# Patient Record
Sex: Female | Born: 1990 | Race: White | Hispanic: No | Marital: Single | Smoking: Never smoker
Health system: Southern US, Community
[De-identification: ages and names within clinical notes are randomized; demographics above are authoritative.]

## PROBLEM LIST (undated history)

## (undated) DIAGNOSIS — T7840XA Allergy, unspecified, initial encounter: Secondary | ICD-10-CM

## (undated) DIAGNOSIS — K259 Gastric ulcer, unspecified as acute or chronic, without hemorrhage or perforation: Secondary | ICD-10-CM

## (undated) DIAGNOSIS — F419 Anxiety disorder, unspecified: Secondary | ICD-10-CM

## (undated) DIAGNOSIS — J45909 Unspecified asthma, uncomplicated: Secondary | ICD-10-CM

## (undated) HISTORY — DX: Anxiety disorder, unspecified: F41.9

## (undated) HISTORY — DX: Unspecified asthma, uncomplicated: J45.909

## (undated) HISTORY — DX: Allergy, unspecified, initial encounter: T78.40XA

## (undated) HISTORY — PX: KNEE SURGERY: SHX244

---

## 2012-08-19 ENCOUNTER — Encounter (HOSPITAL_COMMUNITY): Payer: Self-pay

## 2012-08-19 ENCOUNTER — Emergency Department (HOSPITAL_COMMUNITY)
Admission: EM | Admit: 2012-08-19 | Discharge: 2012-08-20 | Disposition: A | Discharge status: Home / Self Care | Payer: 59 | Attending: Emergency Medicine | Admitting: Emergency Medicine

## 2012-08-19 DIAGNOSIS — F17200 Nicotine dependence, unspecified, uncomplicated: Secondary | ICD-10-CM | POA: Insufficient documentation

## 2012-08-19 DIAGNOSIS — R109 Unspecified abdominal pain: Secondary | ICD-10-CM

## 2012-08-19 DIAGNOSIS — Z882 Allergy status to sulfonamides status: Secondary | ICD-10-CM | POA: Insufficient documentation

## 2012-08-19 HISTORY — DX: Gastric ulcer, unspecified as acute or chronic, without hemorrhage or perforation: K25.9

## 2012-08-19 LAB — CBC WITH DIFFERENTIAL/PLATELET
Basophils Absolute: 0 10*3/uL (ref 0.0–0.1)
Basophils Relative: 0 % (ref 0–1)
Eosinophils Absolute: 0.1 10*3/uL (ref 0.0–0.7)
Eosinophils Relative: 1 % (ref 0–5)
MCH: 28.3 pg (ref 26.0–34.0)
MCHC: 33 g/dL (ref 30.0–36.0)
MCV: 85.7 fL (ref 78.0–100.0)
Neutrophils Relative %: 60 % (ref 43–77)
Platelets: 193 10*3/uL (ref 150–400)
RBC: 4.35 MIL/uL (ref 3.87–5.11)
RDW: 12.2 % (ref 11.5–15.5)

## 2012-08-19 LAB — URINALYSIS, ROUTINE W REFLEX MICROSCOPIC
Bilirubin Urine: NEGATIVE
Hgb urine dipstick: NEGATIVE
Nitrite: NEGATIVE
Specific Gravity, Urine: 1.027 (ref 1.005–1.030)
Urobilinogen, UA: 1 mg/dL (ref 0.0–1.0)
pH: 6.5 (ref 5.0–8.0)

## 2012-08-19 LAB — COMPREHENSIVE METABOLIC PANEL
ALT: 7 U/L (ref 0–35)
Albumin: 3.8 g/dL (ref 3.5–5.2)
Alkaline Phosphatase: 60 U/L (ref 39–117)
Calcium: 8.9 mg/dL (ref 8.4–10.5)
GFR calc Af Amer: >90 mL/min (ref >90)
Potassium: 3.7 mEq/L (ref 3.5–5.1)
Sodium: 137 mEq/L (ref 135–145)
Total Protein: 6.7 g/dL (ref 6.0–8.3)

## 2012-08-19 LAB — WET PREP, GENITAL
Clue Cells Wet Prep HPF POC: NONE SEEN
Trich, Wet Prep: NONE SEEN

## 2012-08-19 LAB — PREGNANCY, URINE: Preg Test, Ur: NEGATIVE

## 2012-08-19 MED ORDER — ONDANSETRON HCL 4 MG/2ML IJ SOLN
4.0000 mg | Freq: Once | INTRAMUSCULAR | Status: DC
  Filled 2012-08-19: qty 2

## 2012-08-19 MED ORDER — PANTOPRAZOLE SODIUM 40 MG IV SOLR
40.0000 mg | Freq: Once | INTRAVENOUS | Status: DC
  Filled 2012-08-19: qty 40

## 2012-08-19 NOTE — ED Notes (Signed)
MD at bedside. 

## 2012-08-19 NOTE — ED Notes (Signed)
Per pt has hx of ulcers.  Pt states she has had intermittent pain for 8 months.  Was seen by urgent care a couple of weeks ago. Pt states she is spitting up blood today.  Minimal amount. Pt taking no ulcer meds.  Pt does not have gastroenterologist.  Pt also having nausea.

## 2012-08-19 NOTE — ED Provider Notes (Signed)
History     CSN: [id number]  Arrival date & time 08/19/12  2113   First MD Initiated Contact with Patient 08/19/12 2235      Chief Complaint  Patient presents with  . Abdominal Pain    (Consider location/radiation/quality/duration/timing/severity/associated sxs/prior treatment) HPI  21 year old female with hx of gastric ulcer presents c/o abd pain.  Pt reports for the past 8-9 months she has been having intermittent pain to her epigastric region.  Pain is sharp, throbbing lasting for hours and usually worse in the morning.  She would have 2-3 episodes per week.  Sometimes pt would vomit bright red blood (a palm full) when she is having her pain.  She was seen in Urgent Care 2 weeks ago for same, and her work up was unremarkable.  Pt has not been taking any medications or any treatment for her sxs.  Today her pain returns after she was working out.  Sts pain felt different. Pain this time is to her periumbilical region and radiates down to her lower abdomen.  Pain is sharp, throbbing lasting for a few hours and has improved.  She did vomit up bright red blood once again, minimal amount.  Pt denies fever, chills, cp, sob, back pain, urinary sxs, vaginal discharge, or rash.  LMP 3 weeks ago.  Pt sts since her pain has caused her to miss school, she wanted to find out the cause of her pain.  She requesting school note.  Currently only rate pain as 2/10.  No nausea.    Past Medical History  Diagnosis Date  . Gastric ulcer     Past Surgical History  Procedure Date  . Knee surgery     History reviewed. No pertinent family history.  History  Substance Use Topics  . Smoking status: Current Every Day Smoker    Types: Pipe  . Smokeless tobacco: Not on file  . Alcohol Use: Yes     social    OB History    Grav Para Term Preterm Abortions TAB SAB Ect Mult Living                  Review of Systems  All other systems reviewed and are negative.    Allergies  Sulfa  antibiotics  Home Medications  No current outpatient prescriptions on file.  BP 117/73  Pulse 82  Temp 98.6 F (37 C) (Oral)  Resp 18  Wt 161 lb (73.029 kg)  SpO2 100%  LMP 08/02/2012  Physical Exam  Nursing note and vitals reviewed. Constitutional: She is oriented to person, place, and time. She appears well-developed and well-nourished. No distress.  HENT:  Head: Normocephalic and atraumatic.  Mouth/Throat: Oropharynx is clear and moist.  Eyes: Conjunctivae normal are normal.  Neck: Normal range of motion. Neck supple.  Cardiovascular: Normal rate and regular rhythm.   Pulmonary/Chest: Effort normal and breath sounds normal. She exhibits no tenderness.  Abdominal: Soft. There is no hepatosplenomegaly. There is tenderness in the suprapubic area. There is no rigidity, no rebound, no guarding, no CVA tenderness, no tenderness at McBurney's point and negative Murphy's sign. No hernia. Hernia confirmed negative in the ventral area, confirmed negative in the right inguinal area and confirmed negative in the left inguinal area.    Genitourinary: Vagina normal and uterus normal. Guaiac negative stool. There is no rash or lesion on the right labia. There is no rash or lesion on the left labia. Cervix exhibits no motion tenderness and no discharge. Right  adnexum displays no mass and no tenderness. Left adnexum displays no mass and no tenderness. No erythema, tenderness or bleeding around the vagina. No vaginal discharge found.       Chaperone present  Musculoskeletal: Normal range of motion. She exhibits no edema and no tenderness.  Lymphadenopathy:       Right: No inguinal adenopathy present.       Left: No inguinal adenopathy present.  Neurological: She is alert and oriented to person, place, and time.  Skin: Skin is warm. No rash noted.  Psychiatric: She has a normal mood and affect.    ED Course  Procedures (including critical care time)  Labs Reviewed  COMPREHENSIVE METABOLIC  PANEL - Abnormal; Notable for the following:    Total Bilirubin 0.2 (*)     All other components within normal limits  CBC WITH DIFFERENTIAL  LIPASE, BLOOD  URINALYSIS, ROUTINE W REFLEX MICROSCOPIC  PREGNANCY, URINE   Results for orders placed during the hospital encounter of 08/19/12  CBC WITH DIFFERENTIAL      Component Value Range   WBC 7.1  4.0 - 10.5 K/uL   RBC 4.35  3.87 - 5.11 MIL/uL   Hemoglobin 12.3  12.0 - 15.0 g/dL   HCT 16.1  09.6 - 04.5 %   MCV 85.7  78.0 - 100.0 fL   MCH 28.3  26.0 - 34.0 pg   MCHC 33.0  30.0 - 36.0 g/dL   RDW 40.9  81.1 - 91.4 %   Platelets 193  150 - 400 K/uL   Neutrophils Relative 60  43 - 77 %   Neutro Abs 4.2  1.7 - 7.7 K/uL   Lymphocytes Relative 30  12 - 46 %   Lymphs Abs 2.1  0.7 - 4.0 K/uL   Monocytes Relative 8  3 - 12 %   Monocytes Absolute 0.6  0.1 - 1.0 K/uL   Eosinophils Relative 1  0 - 5 %   Eosinophils Absolute 0.1  0.0 - 0.7 K/uL   Basophils Relative 0  0 - 1 %   Basophils Absolute 0.0  0.0 - 0.1 K/uL  COMPREHENSIVE METABOLIC PANEL      Component Value Range   Sodium 137  135 - 145 mEq/L   Potassium 3.7  3.5 - 5.1 mEq/L   Chloride 101  96 - 112 mEq/L   CO2 27  19 - 32 mEq/L   Glucose, Bld 89  70 - 99 mg/dL   BUN 14  6 - 23 mg/dL   Creatinine, Ser 7.82  0.50 - 1.10 mg/dL   Calcium 8.9  8.4 - 95.6 mg/dL   Total Protein 6.7  6.0 - 8.3 g/dL   Albumin 3.8  3.5 - 5.2 g/dL   AST 16  0 - 37 U/L   ALT 7  0 - 35 U/L   Alkaline Phosphatase 60  39 - 117 U/L   Total Bilirubin 0.2 (*) 0.3 - 1.2 mg/dL   GFR calc non Af Amer >90  >90 mL/min   GFR calc Af Amer >90  >90 mL/min  LIPASE, BLOOD      Component Value Range   Lipase 42  11 - 59 U/L  URINALYSIS, ROUTINE W REFLEX MICROSCOPIC      Component Value Range   Color, Urine YELLOW  YELLOW   APPearance CLOUDY (*) CLEAR   Specific Gravity, Urine 1.027  1.005 - 1.030   pH 6.5  5.0 - 8.0   Glucose, UA NEGATIVE  NEGATIVE  mg/dL   Hgb urine dipstick NEGATIVE  NEGATIVE   Bilirubin  Urine NEGATIVE  NEGATIVE   Ketones, ur NEGATIVE  NEGATIVE mg/dL   Protein, ur NEGATIVE  NEGATIVE mg/dL   Urobilinogen, UA 1.0  0.0 - 1.0 mg/dL   Nitrite NEGATIVE  NEGATIVE   Leukocytes, UA NEGATIVE  NEGATIVE  PREGNANCY, URINE      Component Value Range   Preg Test, Ur NEGATIVE  NEGATIVE  WET PREP, GENITAL      Component Value Range   Yeast Wet Prep HPF POC NONE SEEN  NONE SEEN   Trich, Wet Prep NONE SEEN  NONE SEEN   Clue Cells Wet Prep HPF POC NONE SEEN  NONE SEEN   WBC, Wet Prep HPF POC RARE (*) NONE SEEN  OCCULT BLOOD, POC DEVICE      Component Value Range   Fecal Occult Bld NEGATIVE     No results found.  1. Abdominal pain  MDM  Pt with vague abd pain which has been intermittent x 8-9 months.  She is mildly tender to suprapubic region but no tenderness to epigastric area.  Abd nonsurgica.  Pelvic exam unremarkable.  She is currently sxs free.  Afebrile, VSS.     Will refer to GI for further care.  Pt did not have any episode of hematemesis in ED.  She is pain free and request discharge.    BP 117/73  Pulse 82  Temp 98.6 F (37 C) (Oral)  Resp 18  Wt 161 lb (73.029 kg)  SpO2 100%  LMP 08/02/2012  Nursing notes reviewed and considered in documentation  Previous records reviewed and considered  All labs/vitals reviewed and considered     Fayrene Helper, PA-C 08/20/12 0005

## 2012-08-20 LAB — GC/CHLAMYDIA PROBE AMP, GENITAL
Chlamydia, DNA Probe: NEGATIVE
GC Probe Amp, Genital: NEGATIVE

## 2012-08-20 LAB — POCT I-STAT TROPONIN I: Troponin i, poc: 0 ng/mL (ref 0.00–0.08)

## 2012-08-22 NOTE — ED Provider Notes (Signed)
Medical screening examination/treatment/procedure(s) were performed by non-physician practitioner and as supervising physician I was immediately available for consultation/collaboration.   Perlie Stene, MD 08/22/12 0732 

## 2012-09-10 ENCOUNTER — Emergency Department (HOSPITAL_COMMUNITY)
Admission: EM | Admit: 2012-09-10 | Discharge: 2012-09-10 | Disposition: A | Discharge status: Home / Self Care | Payer: 59 | Attending: Emergency Medicine | Admitting: Emergency Medicine

## 2012-09-10 ENCOUNTER — Ambulatory Visit (INDEPENDENT_AMBULATORY_CARE_PROVIDER_SITE_OTHER): Payer: 59 | Admitting: Family Medicine

## 2012-09-10 ENCOUNTER — Emergency Department (HOSPITAL_COMMUNITY): Payer: 59

## 2012-09-10 ENCOUNTER — Encounter: Payer: Self-pay | Admitting: Family Medicine

## 2012-09-10 ENCOUNTER — Encounter (HOSPITAL_COMMUNITY): Payer: Self-pay | Admitting: Emergency Medicine

## 2012-09-10 VITALS — BP 128/84 | HR 99 | Temp 97.4°F | Resp 18 | Ht 66.5 in | Wt 185.0 lb

## 2012-09-10 DIAGNOSIS — R1110 Vomiting, unspecified: Secondary | ICD-10-CM | POA: Insufficient documentation

## 2012-09-10 DIAGNOSIS — M542 Cervicalgia: Secondary | ICD-10-CM

## 2012-09-10 DIAGNOSIS — R404 Transient alteration of awareness: Secondary | ICD-10-CM | POA: Insufficient documentation

## 2012-09-10 DIAGNOSIS — R55 Syncope and collapse: Secondary | ICD-10-CM | POA: Insufficient documentation

## 2012-09-10 DIAGNOSIS — Z8719 Personal history of other diseases of the digestive system: Secondary | ICD-10-CM | POA: Insufficient documentation

## 2012-09-10 DIAGNOSIS — Y92009 Unspecified place in unspecified non-institutional (private) residence as the place of occurrence of the external cause: Secondary | ICD-10-CM | POA: Insufficient documentation

## 2012-09-10 DIAGNOSIS — Z8659 Personal history of other mental and behavioral disorders: Secondary | ICD-10-CM | POA: Insufficient documentation

## 2012-09-10 DIAGNOSIS — J45909 Unspecified asthma, uncomplicated: Secondary | ICD-10-CM | POA: Insufficient documentation

## 2012-09-10 DIAGNOSIS — R51 Headache: Secondary | ICD-10-CM

## 2012-09-10 DIAGNOSIS — Y9301 Activity, walking, marching and hiking: Secondary | ICD-10-CM | POA: Insufficient documentation

## 2012-09-10 DIAGNOSIS — S0993XA Unspecified injury of face, initial encounter: Secondary | ICD-10-CM | POA: Insufficient documentation

## 2012-09-10 DIAGNOSIS — W19XXXA Unspecified fall, initial encounter: Secondary | ICD-10-CM | POA: Insufficient documentation

## 2012-09-10 LAB — CBC WITH DIFFERENTIAL/PLATELET
Basophils Absolute: 0 10*3/uL (ref 0.0–0.1)
Basophils Relative: 1 % (ref 0–1)
Eosinophils Absolute: 0.1 10*3/uL (ref 0.0–0.7)
Eosinophils Relative: 2 % (ref 0–5)
HCT: 36.8 % (ref 36.0–46.0)
Hemoglobin: 12.3 g/dL (ref 12.0–15.0)
Lymphocytes Relative: 31 % (ref 12–46)
Lymphs Abs: 1.4 10*3/uL (ref 0.7–4.0)
MCH: 28.7 pg (ref 26.0–34.0)
MCHC: 33.4 g/dL (ref 30.0–36.0)
MCV: 85.8 fL (ref 78.0–100.0)
Monocytes Absolute: 0.4 10*3/uL (ref 0.1–1.0)
Monocytes Relative: 10 % (ref 3–12)
Neutro Abs: 2.5 10*3/uL (ref 1.7–7.7)
Neutrophils Relative %: 57 % (ref 43–77)
Platelets: 200 10*3/uL (ref 150–400)
RBC: 4.29 MIL/uL (ref 3.87–5.11)
RDW: 12.2 % (ref 11.5–15.5)
WBC: 4.4 10*3/uL (ref 4.0–10.5)

## 2012-09-10 LAB — COMPREHENSIVE METABOLIC PANEL
ALT: 6 U/L (ref 0–35)
AST: 14 U/L (ref 0–37)
Albumin: 3.8 g/dL (ref 3.5–5.2)
Alkaline Phosphatase: 63 U/L (ref 39–117)
BUN: 14 mg/dL (ref 6–23)
CO2: 27 mEq/L (ref 19–32)
Calcium: 9.1 mg/dL (ref 8.4–10.5)
Chloride: 102 mEq/L (ref 96–112)
Creatinine, Ser: 0.7 mg/dL (ref 0.50–1.10)
GFR calc Af Amer: >90 mL/min (ref >90)
GFR calc non Af Amer: >90 mL/min (ref >90)
Glucose, Bld: 92 mg/dL (ref 70–99)
Potassium: 4.2 mEq/L (ref 3.5–5.1)
Sodium: 136 mEq/L (ref 135–145)
Total Bilirubin: 0.3 mg/dL (ref 0.3–1.2)
Total Protein: 6.8 g/dL (ref 6.0–8.3)

## 2012-09-10 LAB — POCT CBC
HCT, POC: 44 % (ref 37.7–47.9)
Lymph, poc: 1.6 (ref 0.6–3.4)
MCH, POC: 27.7 pg (ref 27–31.2)
MCHC: 31.1 g/dL — AB (ref 31.8–35.4)
MCV: 88.9 fL (ref 80–97)
MID (cbc): 0.3 (ref 0–0.9)
POC LYMPH PERCENT: 33.9 %L (ref 10–50)
Platelet Count, POC: 237 10*3/uL (ref 142–424)
RDW, POC: 12.4 %

## 2012-09-10 LAB — URINALYSIS, ROUTINE W REFLEX MICROSCOPIC
Bilirubin Urine: NEGATIVE
Glucose, UA: NEGATIVE mg/dL
Leukocytes, UA: NEGATIVE
Nitrite: NEGATIVE
Specific Gravity, Urine: 1.013 (ref 1.005–1.030)
pH: 7.5 (ref 5.0–8.0)

## 2012-09-10 MED ORDER — SODIUM CHLORIDE 0.9 % IV BOLUS (SEPSIS)
1000.0000 mL | Freq: Once | INTRAVENOUS | Status: AC
  Administered 2012-09-10: 1000 mL via INTRAVENOUS

## 2012-09-10 NOTE — ED Notes (Signed)
Patient reports syncopal episode earlier today while getting ready for school.  Patient was standing in the bathroom when she passed out, and had +LOC.  Patient has swelling and tenderness on right cheek and above right eye.

## 2012-09-10 NOTE — Progress Notes (Signed)
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

## 2012-09-10 NOTE — ED Notes (Signed)
[id number]<AV> Expected date:<BR> Expected time:<BR> Means of arrival:<BR> Comments:<BR> 21yoF-Fall, neck pain, syncopal episode, vomiting

## 2012-09-10 NOTE — ED Notes (Signed)
MD at bedside. 

## 2012-09-10 NOTE — Progress Notes (Signed)
  Subjective:    Patient ID: Kelsey Solis, female    DOB: Aug 09, 1991, 21 y.o.   MRN: [id number]  HPI Kelsey Solis is a 21 y.o. female Status post syncopal episode this morning around 9 am. No known preceeding headache, or other illness. LMP 6 days ago, G0P0, no hx of seizure disorder. Unknown time of LOC, but woke up on bathroom floor.  Was planning on taking a shower, but shower had not been turned on.  Slept ok night prior.  When woke up - had R sided temple headache, now all over - worse throbbing, vomiting within 10 minutes of waking up, and 2 other episodes since.  R eye blurry, and notes swelling over R eye, and forehead. Has had trouble forming words, difficulty with thoughts, and stuttering at times. Also noticed neck pain - lower neck to L sided neck muscles. No back pain, no focal weakness in arms, legs, neck, but feels weak all over.   Review of Systems  HENT: Positive for facial swelling and neck pain.   Eyes: Positive for visual disturbance.  Respiratory: Negative for shortness of breath.   Gastrointestinal: Positive for nausea and vomiting. Negative for blood in stool.  Genitourinary: Negative for difficulty urinating.  Musculoskeletal: Positive for arthralgias. Negative for back pain.  Skin: Negative for rash.  Neurological: Positive for syncope, speech difficulty and headaches.   As above. No incontinence, no bleeding from mouth or tongue injury.     Objective:   Physical Exam  Vitals reviewed. Constitutional: She appears well-developed and well-nourished. No distress.  HENT:  Head: Head is without raccoon's eyes and without Battle's sign.    Right Ear: Tympanic membrane normal. No mastoid tenderness. No hemotympanum.  Left Ear: Tympanic membrane normal. No mastoid tenderness. No hemotympanum.  Eyes: Conjunctivae normal and lids are normal. Pupils are equal, round, and reactive to light. Right eye exhibits no nystagmus. Left eye exhibits no nystagmus.    Cardiovascular: Normal rate, regular rhythm, normal heart sounds and intact distal pulses.   No murmur heard. Pulmonary/Chest: Effort normal and breath sounds normal. No respiratory distress.  Abdominal: Soft. Bowel sounds are normal. She exhibits no distension. There is no tenderness.  Musculoskeletal:       Cervical back: She exhibits bony tenderness.       Back:  Neurological: She is alert. She has normal strength. No sensory deficit.       Slow/delayed in responses at times, but no slur with speech. Appears confused at times during exam. Gross strength testing without focal deficit.   Skin: Skin is warm and dry. No rash noted. She is not diaphoretic.      Assessment & Plan:  Kelsey Solis is a 21 y.o. female 1. Syncope  POCT CBC  2. Headache  POCT CBC  3. Vomiting    4. Neck pain     Syncopal epeisode with secondary headache, facial pain with possible orbital injury, and now with multiple episodes of vomiting, worsening headache, and R vision change concerning for concussion vs ICB. Possible neck strain, but with midline/bony ttp after syncopal episode and unknown MOPI - sandbags placed laterally for maintaining c spine, and EMS called for transport.  EMS placed spine board and collar for transport.  Press photographer at [facility] advised.

## 2012-09-10 NOTE — ED Notes (Signed)
Pt given ice pack

## 2012-09-10 NOTE — ED Provider Notes (Signed)
History     CSN: [id number]  Arrival date & time 09/10/12  1154   First MD Initiated Contact with Patient 09/10/12 1156      Chief Complaint  Patient presents with  . Loss of Consciousness  . Emesis    (Consider location/radiation/quality/duration/timing/severity/associated sxs/prior treatment) HPI Comments: Patient reports she drove home from her friend's house this morning, was going to take a shower but does not remember walking to the bathroom, then woke up on the floor of the bathroom.  Reports pain in her right face, right neck.  States after she fell she became nauseated and started vomiting - emesis was nonbloody.  Now she feels "weak all over."  States prior to this she has been feeling well.  Previously seen in ED for hematemesis, states this is much improved and she only vomits occasionally with a small amount of blood - states this only happens when she is stressed.  Denies fevers, CP, SOB, palpitations, abdominal pain, change in bowel habits, hematochezia or melena.   Patient is a 21 y.o. female presenting with syncope and vomiting. The history is provided by the patient.  Loss of Consciousness Associated symptoms include nausea and vomiting. Pertinent negatives include no abdominal pain, chest pain, coughing or fever.  Emesis  Pertinent negatives include no abdominal pain, no cough, no diarrhea and no fever.    Past Medical History  Diagnosis Date  . Gastric ulcer   . Allergy   . Asthma   . Anxiety     Past Surgical History  Procedure Date  . Knee surgery     Family History  Problem Relation Age of Onset  . Fainting Sister     History  Substance Use Topics  . Smoking status: Never Smoker   . Smokeless tobacco: Not on file  . Alcohol Use: Yes     social    OB History    Grav Para Term Preterm Abortions TAB SAB Ect Mult Living                  Review of Systems  Constitutional: Negative for fever.  Respiratory: Negative for cough and shortness  of breath.   Cardiovascular: Positive for syncope. Negative for chest pain and palpitations.  Gastrointestinal: Positive for nausea and vomiting. Negative for abdominal pain, diarrhea, constipation and blood in stool.  Genitourinary: Negative for dysuria, urgency, frequency, vaginal bleeding, vaginal discharge and menstrual problem.    Allergies  Sulfa antibiotics  Home Medications  No current outpatient prescriptions on file.  BP 113/63  Pulse 73  Temp 97.9 F (36.6 C) (Oral)  Resp 16  SpO2 100%  LMP 09/01/2012  Physical Exam  Nursing note and vitals reviewed. Constitutional: She appears well-developed and well-nourished. No distress.  HENT:  Head: Normocephalic.    Eyes: EOM are normal.  Neck: Neck supple.  Cardiovascular: Normal rate and regular rhythm.   Pulmonary/Chest: Effort normal and breath sounds normal. No respiratory distress. She has no wheezes. She has no rales.  Abdominal: Soft. She exhibits no distension and no mass. There is no tenderness. There is no rebound and no guarding.  Musculoskeletal: Normal range of motion. She exhibits no tenderness.       Right hip: She exhibits normal range of motion, no tenderness and no bony tenderness.       Left hip: She exhibits normal range of motion, no tenderness and no bony tenderness.       Thoracic back: She exhibits no bony tenderness.  Lumbar back: She exhibits no bony tenderness.  Neurological: She is alert.  Skin: She is not diaphoretic.    ED Course  Procedures (including critical care time)   Labs Reviewed  CBC WITH DIFFERENTIAL  COMPREHENSIVE METABOLIC PANEL  URINALYSIS, ROUTINE W REFLEX MICROSCOPIC  PREGNANCY, URINE   Ct Head Wo Contrast  09/10/2012  *RADIOLOGY REPORT*  Clinical Data:  Fall, loss of consciousness, emesis, left neck and right vertex pain, right orbital pain, abrasion and swelling, generalized weakness, blurred vision right eye, expressive aphasia  CT HEAD WITHOUT CONTRAST CT  MAXILLOFACIAL WITHOUT CONTRAST CT CERVICAL SPINE WITHOUT CONTRAST  Technique:  Multidetector CT imaging of the head, cervical spine, and maxillofacial structures were performed using the standard protocol without intravenous contrast. Multiplanar CT image reconstructions of the cervical spine and maxillofacial structures were also generated.  Comparison:  None  CT HEAD  Findings: Asymmetric positioning in gantry. Normal ventricular morphology. No midline shift or mass effect. Few scattered streak artifacts from patient motion. No definite intracranial hemorrhage, mass lesion or evidence of acute infarction. No extra-axial fluid collections. Within limitations of motion, visualized bones and sinuses unremarkable.  IMPRESSION: No definite acute intracranial abnormalities on exam mildly limited by patient motion.  CT MAXILLOFACIAL  Findings: Right periorbital soft tissue swelling/contusion. Intraorbital soft tissue planes clear. Visualized intracranial structures unremarkable. Nasal septal deviation to the right. Visualized mastoid air cells, paranasal sinuses, and middle ear cavities clear. No facial bone fractures identified.  IMPRESSION: No acute facial bony abnormalities.  CT CERVICAL SPINE  Findings: Visualized skull base intact. Asymmetric positioning with lateral cervical flexion to the left and mild head rotation.  Prevertebral soft tissues normal thickness. Vertebral body and disc space heights maintained. No acute fracture, subluxation, or bone destruction. Tips of lung apices clear.  IMPRESSION: No acute cervical spine abnormalities identified.   Original Report Authenticated By: Lollie Marrow, M.D.    Ct Cervical Spine Wo Contrast  09/10/2012  *RADIOLOGY REPORT*  Clinical Data:  Fall, loss of consciousness, emesis, left neck and right vertex pain, right orbital pain, abrasion and swelling, generalized weakness, blurred vision right eye, expressive aphasia  CT HEAD WITHOUT CONTRAST CT MAXILLOFACIAL  WITHOUT CONTRAST CT CERVICAL SPINE WITHOUT CONTRAST  Technique:  Multidetector CT imaging of the head, cervical spine, and maxillofacial structures were performed using the standard protocol without intravenous contrast. Multiplanar CT image reconstructions of the cervical spine and maxillofacial structures were also generated.  Comparison:  None  CT HEAD  Findings: Asymmetric positioning in gantry. Normal ventricular morphology. No midline shift or mass effect. Few scattered streak artifacts from patient motion. No definite intracranial hemorrhage, mass lesion or evidence of acute infarction. No extra-axial fluid collections. Within limitations of motion, visualized bones and sinuses unremarkable.  IMPRESSION: No definite acute intracranial abnormalities on exam mildly limited by patient motion.  CT MAXILLOFACIAL  Findings: Right periorbital soft tissue swelling/contusion. Intraorbital soft tissue planes clear. Visualized intracranial structures unremarkable. Nasal septal deviation to the right. Visualized mastoid air cells, paranasal sinuses, and middle ear cavities clear. No facial bone fractures identified.  IMPRESSION: No acute facial bony abnormalities.  CT CERVICAL SPINE  Findings: Visualized skull base intact. Asymmetric positioning with lateral cervical flexion to the left and mild head rotation.  Prevertebral soft tissues normal thickness. Vertebral body and disc space heights maintained. No acute fracture, subluxation, or bone destruction. Tips of lung apices clear.  IMPRESSION: No acute cervical spine abnormalities identified.   Original Report Authenticated By: Lollie Marrow, M.D.  Ct Maxillofacial Wo Cm  09/10/2012  *RADIOLOGY REPORT*  Clinical Data:  Fall, loss of consciousness, emesis, left neck and right vertex pain, right orbital pain, abrasion and swelling, generalized weakness, blurred vision right eye, expressive aphasia  CT HEAD WITHOUT CONTRAST CT MAXILLOFACIAL WITHOUT CONTRAST CT  CERVICAL SPINE WITHOUT CONTRAST  Technique:  Multidetector CT imaging of the head, cervical spine, and maxillofacial structures were performed using the standard protocol without intravenous contrast. Multiplanar CT image reconstructions of the cervical spine and maxillofacial structures were also generated.  Comparison:  None  CT HEAD  Findings: Asymmetric positioning in gantry. Normal ventricular morphology. No midline shift or mass effect. Few scattered streak artifacts from patient motion. No definite intracranial hemorrhage, mass lesion or evidence of acute infarction. No extra-axial fluid collections. Within limitations of motion, visualized bones and sinuses unremarkable.  IMPRESSION: No definite acute intracranial abnormalities on exam mildly limited by patient motion.  CT MAXILLOFACIAL  Findings: Right periorbital soft tissue swelling/contusion. Intraorbital soft tissue planes clear. Visualized intracranial structures unremarkable. Nasal septal deviation to the right. Visualized mastoid air cells, paranasal sinuses, and middle ear cavities clear. No facial bone fractures identified.  IMPRESSION: No acute facial bony abnormalities.  CT CERVICAL SPINE  Findings: Visualized skull base intact. Asymmetric positioning with lateral cervical flexion to the left and mild head rotation.  Prevertebral soft tissues normal thickness. Vertebral body and disc space heights maintained. No acute fracture, subluxation, or bone destruction. Tips of lung apices clear.  IMPRESSION: No acute cervical spine abnormalities identified.   Original Report Authenticated By: Lollie Marrow, M.D.      Date: 09/10/2012  Rate: 64  Rhythm: sinus arrhythmia  QRS Axis: normal  Intervals: normal  ST/T Wave abnormalities: normal  Conduction Disutrbances:none  Narrative Interpretation:   Old EKG Reviewed: none available  2:05 PM Discussed patient and findings with Dr Juleen China.  Plan is for d/c home with PCP follow up.   1. Syncope     2. Fall   3. Facial pain     MDM  Pt with syncopal episode without preceding symptoms this morning.  Pt does have increased level of stress.  EKG shows only sinus arrhythmia, which patient is known to have (has previously been tested with holter monitor).  Hgb normal.  Labs unremarkable.  Pt is not pregnant.  Pt with normal mental status and neurologically grossly intact.  CT head, maxillofacial, c-spine negative.  Pt with possible mild contusion of right face.  Pt has health insurance, will be able to follow up with PCP.  Discussed all results with patient.  Pt given return precautions.  Pt verbalizes understanding and agrees with plan.           [place], [place] 09/10/12 1534

## 2012-09-15 NOTE — ED Provider Notes (Signed)
Medical screening examination/treatment/procedure(s) were performed by non-physician practitioner and as supervising physician I was immediately available for consultation/collaboration.  Raeford Razor, MD 09/15/12 0010

## 2013-05-17 ENCOUNTER — Ambulatory Visit (INDEPENDENT_AMBULATORY_CARE_PROVIDER_SITE_OTHER): Payer: BC Managed Care – PPO | Admitting: Family Medicine

## 2013-05-17 VITALS — BP 114/70 | HR 96 | Temp 98.0°F | Resp 17 | Ht 67.0 in | Wt 167.0 lb

## 2013-05-17 DIAGNOSIS — J029 Acute pharyngitis, unspecified: Secondary | ICD-10-CM

## 2013-05-17 MED ORDER — CEFDINIR 300 MG PO CAPS: 300.0000 mg | ORAL_CAPSULE | Freq: Two times a day (BID) | ORAL | Status: DC

## 2013-05-17 NOTE — Progress Notes (Signed)
      Patient ID: Kelsey Solis MRN: [id number], DOB: 1990-12-23, 22 y.o. Date of Encounter: 05/17/2013, 10:10 AM  Primary Physician: No primary provider on file.  Chief Complaint:  Chief Complaint  Patient presents with  . Sore Throat    lump in throat    HPI: 22 y.o. year old female presents with 7 day history of sore throat. Subjective fever and chills. No cough, congestion, rhinorrhea, sinus pressure, otalgia, or headache. Normal hearing. No GI complaints. Able to swallow saliva, but hurts to do so. Decreased appetite secondary to sore throat.   Working at [employer] this summer  Past Medical History  Diagnosis Date  . Gastric ulcer   . Allergy   . Asthma   . Anxiety      Home Meds: Prior to Admission medications   Not on File    Allergies:  Allergies  Allergen Reactions  . Sulfa Antibiotics Anaphylaxis    History   Social History  . Marital Status: Single    Spouse Name: N/A    Number of Children: N/A  . Years of Education: N/A   Occupational History  . Not on file.   Social History Main Topics  . Smoking status: Never Smoker   . Smokeless tobacco: Not on file  . Alcohol Use: Yes     Comment: social  . Drug Use: No  . Sexually Active: Yes    Birth Control/ Protection: None, Other-see comments   Other Topics Concern  . Not on file   Social History Narrative  . No narrative on file     Review of Systems: Constitutional: negative for chills, fever, night sweats or weight changes HEENT: see above Cardiovascular: negative for chest pain or palpitations Respiratory: negative for hemoptysis, wheezing, or shortness of breath Abdominal: negative for abdominal pain, nausea, vomiting or diarrhea Dermatological: negative for rash Neurologic: negative for headache   Physical Exam: Blood pressure 114/70, pulse 96, temperature 98 F (36.7 C), temperature source Oral, resp. rate 17, height 5\' 7"  (1.702 m), weight 167 lb (75.751 kg), last menstrual period  04/24/2013, SpO2 98.00%., Body mass index is 26.15 kg/(m^2). General: Well developed, well nourished, in no acute distress. Head: Normocephalic, atraumatic, eyes without discharge, sclera non-icteric, nares are patent. Bilateral auditory canals clear, TM's are without perforation, pearly grey with reflective cone of light bilaterally. No sinus TTP. Oral cavity moist, dentition normal. Posterior pharynx with post nasal drip and mild erythema. No peritonsillar abscess or tonsillar exudate. Neck: Supple. No thyromegaly. Full ROM. No lymphadenopathy. Lungs: Clear bilaterally to auscultation without wheezes, rales, or rhonchi. Breathing is unlabored. Heart: RRR with S1 S2. No murmurs, rubs, or gallops appreciated. Abdomen: Soft, non-tender, non-distended with normoactive bowel sounds. No hepatomegaly. No rebound/guarding. No obvious abdominal masses. Msk:  Strength and tone normal for age. Extremities: No clubbing or cyanosis. No edema. Neuro: Alert and oriented X 3. Moves all extremities spontaneously. CNII-XII grossly in tact. Psych:  Responds to questions appropriately with a normal affect.   Labs:   ASSESSMENT AND PLAN:  22 y.o. year old female with pharyngitis - -Tylenol/Motrin prn -Rest/fluids -RTC precautions -RTC 3-5 days if no improvement  Signed, Elvina Sidle, MD 05/17/2013 10:10 AM

## 2013-05-17 NOTE — Patient Instructions (Signed)
Probiotics:  Brand names are Building control surveyor

## 2013-05-19 LAB — CULTURE, GROUP A STREP: Organism ID, Bacteria: NORMAL

## 2013-07-27 ENCOUNTER — Encounter (HOSPITAL_COMMUNITY): Payer: Self-pay

## 2013-07-27 ENCOUNTER — Emergency Department (HOSPITAL_COMMUNITY)
Admission: EM | Admit: 2013-07-27 | Discharge: 2013-07-28 | Disposition: A | Discharge status: Home / Self Care | Payer: BC Managed Care – PPO | Attending: Emergency Medicine | Admitting: Emergency Medicine

## 2013-07-27 ENCOUNTER — Emergency Department (HOSPITAL_COMMUNITY): Payer: BC Managed Care – PPO

## 2013-07-27 DIAGNOSIS — S8392XA Sprain of unspecified site of left knee, initial encounter: Secondary | ICD-10-CM

## 2013-07-27 DIAGNOSIS — S838X9A Sprain of other specified parts of unspecified knee, initial encounter: Secondary | ICD-10-CM | POA: Insufficient documentation

## 2013-07-27 DIAGNOSIS — X500XXA Overexertion from strenuous movement or load, initial encounter: Secondary | ICD-10-CM | POA: Insufficient documentation

## 2013-07-27 DIAGNOSIS — Z8659 Personal history of other mental and behavioral disorders: Secondary | ICD-10-CM | POA: Insufficient documentation

## 2013-07-27 DIAGNOSIS — S83412A Sprain of medial collateral ligament of left knee, initial encounter: Secondary | ICD-10-CM

## 2013-07-27 DIAGNOSIS — Y9389 Activity, other specified: Secondary | ICD-10-CM | POA: Insufficient documentation

## 2013-07-27 DIAGNOSIS — Z8719 Personal history of other diseases of the digestive system: Secondary | ICD-10-CM | POA: Insufficient documentation

## 2013-07-27 DIAGNOSIS — Y929 Unspecified place or not applicable: Secondary | ICD-10-CM | POA: Insufficient documentation

## 2013-07-27 DIAGNOSIS — J45909 Unspecified asthma, uncomplicated: Secondary | ICD-10-CM | POA: Insufficient documentation

## 2013-07-27 NOTE — ED Notes (Signed)
Pt c/o Left knee pain due to an injury while playing Lacrosse 1900 this pm. Pt states she has had surgery on this knee and felt something pop like it was taring

## 2013-07-27 NOTE — ED Notes (Signed)
Patient transported to X-ray 

## 2013-07-28 MED ORDER — IBUPROFEN 800 MG PO TABS: 800.0000 mg | ORAL_TABLET | Freq: Three times a day (TID) | ORAL | Status: DC | PRN

## 2013-07-28 NOTE — ED Provider Notes (Signed)
Medical screening examination/treatment/procedure(s) were performed by non-physician practitioner and as supervising physician I was immediately available for consultation/collaboration.   Zamyra Allensworth M Raymonde Hamblin, MD 07/28/13 0413 

## 2013-07-28 NOTE — ED Provider Notes (Signed)
This chart was scribed for Kelsey Solis, a non-physician practitioner working with Kelsey Co, MD by Kelsey Solis, ED Scribe. This patient was seen in room TR10C/TR10C and the patient's care was started at 0003   CSN: [id number]     Arrival date & time 07/27/13  2249 History   First MD Initiated Contact with Patient 07/28/13 0002     Chief Complaint  Patient presents with  . Knee Injury    The history is provided by the patient.   HPI Comments: Kelsey Solis is a 22 y.o. female who presents to the Emergency Department complaining of constant moderate left knee pain onset 1900 tonight was tackled while playing rugby.  Reports associated popping after twisting mechanism. Describes pain as burning. Reports associated decreased ROM secondary to pain, and numbness. Denies associated head injury, neck pain, back pain, abdominal pain, and LOC. Reports symptoms are exacerbated by flexion, extension, touch, and weight bearing. Reports trying ice with no relief of symptoms. Reports hx of right dislocation  Denies PMHx of bleeding disorder.    Past Medical History  Diagnosis Date  . Gastric ulcer   . Allergy   . Asthma   . Anxiety    Past Surgical History  Procedure Laterality Date  . Knee surgery     Family History  Problem Relation Age of Onset  . Fainting Sister    History  Substance Use Topics  . Smoking status: Never Smoker   . Smokeless tobacco: Not on file  . Alcohol Use: Yes     Comment: social   OB History   Grav Para Term Preterm Abortions TAB SAB Ect Mult Living                 Review of Systems A complete 10 system review of systems was obtained and all systems are negative except as noted in the HPI and PMH.    Allergies  Sulfa antibiotics  Home Medications  No current outpatient prescriptions on file. BP 134/69  Pulse 94  Temp(Src) 98.4 F (36.9 C) (Oral)  Resp 18  Ht 5\' 6"  (1.676 m)  Wt 160 lb (72.576 kg)  BMI 25.84 kg/m2  SpO2 100%   LMP 07/27/2013 Physical Exam  Nursing note and vitals reviewed. Constitutional: She is oriented to person, place, and time. She appears well-developed and well-nourished. No distress.  HENT:  Head: Normocephalic and atraumatic.  Eyes: EOM are normal.  Neck: Neck supple. No tracheal deviation present.  Cardiovascular: Normal rate.   Pulmonary/Chest: Effort normal. No respiratory distress. She has no wheezes.  Musculoskeletal: She exhibits edema and tenderness.       Right knee: Normal.       Left knee: She exhibits decreased range of motion and swelling.       Left upper leg: She exhibits tenderness.       Legs: Left Patellar tendon intact. No masses in the quadriceps or hamstring area. Pain with flexion or extension of left knee.  There is significant tenderness over the medial aspect of the knee as well as along the insertion of the semitendinosus and semimembranosus tendons.  Examination is limited due to patient discomfort and swelling. She has difficulty relaxing the knee and leg during the exam.  Neurological: She is alert and oriented to person, place, and time.  Skin: Skin is warm and dry.  Psychiatric: She has a normal mood and affect. Her behavior is normal.    ED Course  Procedures   Imaging Review  Dg Knee Complete 4 Views Left  07/27/2013   *RADIOLOGY REPORT*  Clinical Data: Twisting injury to the knee with popping sensation. Soft ball game.  Surgery for knee dislocation 5 years ago.  LEFT KNEE - COMPLETE 4+ VIEW  Comparison: None.  Findings: The left knee appears intact. No evidence of acute fracture or subluxation.  No focal bone lesions.  Bone matrix and cortex appear intact.  No abnormal radiopaque densities in the soft tissues.  No significant effusion.  IMPRESSION: No acute bony abnormalities demonstrated in the left knee.   Original Report Authenticated By: Burman Nieves, M.D.    MDM   1. Knee sprain, left, initial encounter   2. Medial collateral ligament  sprain of knee, left, initial encounter     Patient seen and evaluated. She appears uncomfortable but in no acute distress. X-rays were reviewed without signs of fractures or dislocation. Did have some clinical concern for possible medial collateral ligament injury. There is also significant tenderness around the hamstring insertion point posteriorly. Will place patient in a Knee immobilizer. She states that she has crutches in the car. She does not wish for any medications at this time.    I personally performed the services described in this documentation, which was scribed in my presence. The recorded information has been reviewed and is accurate.    Angus Seller, PA-C 07/28/13 0151

## 2013-07-28 NOTE — ED Notes (Signed)
Ortho tech at bedside for knee immobilizer and crutches 

## 2013-07-28 NOTE — Progress Notes (Signed)
Orthopedic Tech Progress Note Patient Details:  Kelsey Solis 1990-11-23 [id number]  Ortho Devices Type of Ortho Device: Knee Immobilizer;Crutches   Haskell Flirt 07/28/2013, 12:17 AM

## 2013-12-12 ENCOUNTER — Encounter (HOSPITAL_COMMUNITY): Payer: Self-pay | Admitting: Emergency Medicine

## 2013-12-12 ENCOUNTER — Emergency Department (HOSPITAL_COMMUNITY)
Admission: EM | Admit: 2013-12-12 | Discharge: 2013-12-13 | Disposition: A | Discharge status: Home / Self Care | Payer: BC Managed Care – PPO | Attending: Emergency Medicine | Admitting: Emergency Medicine

## 2013-12-12 DIAGNOSIS — R51 Headache: Secondary | ICD-10-CM | POA: Insufficient documentation

## 2013-12-12 DIAGNOSIS — Z8659 Personal history of other mental and behavioral disorders: Secondary | ICD-10-CM | POA: Insufficient documentation

## 2013-12-12 DIAGNOSIS — R519 Headache, unspecified: Secondary | ICD-10-CM

## 2013-12-12 DIAGNOSIS — J45909 Unspecified asthma, uncomplicated: Secondary | ICD-10-CM | POA: Insufficient documentation

## 2013-12-12 DIAGNOSIS — Z8711 Personal history of peptic ulcer disease: Secondary | ICD-10-CM | POA: Insufficient documentation

## 2013-12-12 NOTE — ED Provider Notes (Signed)
CSN: [id number]     Arrival date & time 12/12/13  1742 History   First MD Initiated Contact with Patient 12/12/13 2333     Chief Complaint  Patient presents with  . Headache   (Consider location/radiation/quality/duration/timing/severity/associated sxs/prior Treatment) HPI Patient is a generally healthy 23 yo woman who presents with complaints of left sided retro-orbital headache which has been present for the past 3 months. This was initially intermittent but has been more persistent for the past 4 weeks or so.  The pain is burning and pressure like. It has become more painful and uncomfortable.   Patient denies visual changes. Patient comes to the ED tonight after experiencing about 5 minutes of dysarthria. This occurred as she was talking on the phone. The friend with whom she was talking with could not  Understand her. The episode resolved without intervention.   She experienced a sensation of tingling and then numbness over the left lower face this morning - this lasted 3 minutes, approximately, and also resolved without intervention.   Past Medical History  Diagnosis Date  . Gastric ulcer   . Allergy   . Asthma   . Anxiety    Past Surgical History  Procedure Laterality Date  . Knee surgery     Family History  Problem Relation Age of Onset  . Fainting Sister    History  Substance Use Topics  . Smoking status: Never Smoker   . Smokeless tobacco: Not on file  . Alcohol Use: Yes     Comment: social   OB History   Grav Para Term Preterm Abortions TAB SAB Ect Mult Living                 Review of Systems Ten point review of symptoms performed and is negative with the exception of symptoms noted above.   Allergies  Sulfa antibiotics  Home Medications  No current outpatient prescriptions on file. BP 168/85  Pulse 89  Temp(Src) 98.2 F (36.8 C) (Oral)  Resp 20  SpO2 99%  LMP 11/26/2013 Physical Exam Gen: well developed and well nourished appearing Head:  NCAT Eyes: PERL, EOMI Nose: no epistaixis or rhinorrhea Mouth/throat: mucosa is moist and pink Neck: supple, no stridor Lungs: CTA B, no wheezing, rhonchi or rales CV: RRR, no murmur, extremities appear well perfused.  Abd: soft, notender, nondistended Back: no ttp, no cva ttp Skin: warm and dry Ext: normal to inspection, no dependent edema Neuro: CN ii-xii grossly intact, no focal deficits, 5 over 5 motor strength both arms and legs, sensation intact to light touch throughout, coordination is normal, speech is normal Psyche; normal affect,  calm and cooperative.   ED Course  Procedures (including critical care time)  EKG: nsr, no acute ischemic changes, normal intervals, normal axis, normal qrs complex  CT Head Wo Contrast (Final result)  Result time: 12/13/13 01:14:59    Final result by Rad Results In Interface (12/13/13 01:14:59)    Narrative:   CLINICAL DATA: Headaches  EXAM: CT HEAD WITHOUT CONTRAST  TECHNIQUE: Contiguous axial images were obtained from the base of the skull through the vertex without contrast.  COMPARISON: 09/10/2012  FINDINGS: Normal appearance of the intracranial structures. No evidence for acute hemorrhage, mass lesion, midline shift, hydrocephalus or large infarct. No acute bony abnormality. The visualized sinuses are clear.  IMPRESSION: No acute intracranial abnormality.   Electronically Signed By: Ruel Favors M.D. On: 12/13/2013 01:14     MDM   Case discussed with Dr. Roseanne Reno - Neurohospitalist.  He stated that based on the patient's reported symptoms that she did not need a Neurology consultation. The patient's head CT is wnl and her neurologic exam is normal at this time. The etiology of her headache x 3 months is unclear. However, we can clinically rule out meningitis and SAH. Pt is stable for d/c with plan [facility] of chronic headache.    Brandt LoosenJulie Mishawn Hemann, MD 12/13/13 [phone]

## 2013-12-12 NOTE — ED Notes (Signed)
Pt reports two more symptoms of burning on left side and last nite it lasted for 2 minutes and states that her speech got really thick.

## 2013-12-12 NOTE — ED Notes (Signed)
Pt has been having headaches x several months, Pt describing pain behind left eye, pt had one last night and was having seizure like activity

## 2013-12-12 NOTE — ED Notes (Signed)
Pt reports that she has been having daily headaches that affect left side and is behind her left eye.  Pt states it started at lasting one minute and then resolves.

## 2013-12-13 ENCOUNTER — Emergency Department (HOSPITAL_COMMUNITY): Payer: BC Managed Care – PPO

## 2013-12-13 MED ORDER — TRAMADOL HCL 50 MG PO TABS: 50.0000 mg | ORAL_TABLET | Freq: Four times a day (QID) | ORAL | Status: DC | PRN

## 2014-03-13 ENCOUNTER — Emergency Department (HOSPITAL_COMMUNITY)
Admission: EM | Admit: 2014-03-13 | Discharge: 2014-03-13 | Disposition: A | Discharge status: Home / Self Care | Payer: BC Managed Care – PPO | Attending: Emergency Medicine | Admitting: Emergency Medicine

## 2014-03-13 ENCOUNTER — Encounter (HOSPITAL_COMMUNITY): Payer: Self-pay | Admitting: Emergency Medicine

## 2014-03-13 DIAGNOSIS — Z3202 Encounter for pregnancy test, result negative: Secondary | ICD-10-CM | POA: Insufficient documentation

## 2014-03-13 DIAGNOSIS — F411 Generalized anxiety disorder: Secondary | ICD-10-CM | POA: Insufficient documentation

## 2014-03-13 DIAGNOSIS — R531 Weakness: Secondary | ICD-10-CM

## 2014-03-13 DIAGNOSIS — R34 Anuria and oliguria: Secondary | ICD-10-CM | POA: Insufficient documentation

## 2014-03-13 DIAGNOSIS — R5381 Other malaise: Secondary | ICD-10-CM | POA: Insufficient documentation

## 2014-03-13 DIAGNOSIS — R630 Anorexia: Secondary | ICD-10-CM | POA: Insufficient documentation

## 2014-03-13 DIAGNOSIS — R5383 Other fatigue: Secondary | ICD-10-CM

## 2014-03-13 DIAGNOSIS — R1012 Left upper quadrant pain: Secondary | ICD-10-CM | POA: Insufficient documentation

## 2014-03-13 DIAGNOSIS — Z8719 Personal history of other diseases of the digestive system: Secondary | ICD-10-CM | POA: Insufficient documentation

## 2014-03-13 DIAGNOSIS — J45909 Unspecified asthma, uncomplicated: Secondary | ICD-10-CM | POA: Insufficient documentation

## 2014-03-13 DIAGNOSIS — R109 Unspecified abdominal pain: Secondary | ICD-10-CM

## 2014-03-13 DIAGNOSIS — R110 Nausea: Secondary | ICD-10-CM | POA: Insufficient documentation

## 2014-03-13 LAB — LIPASE, BLOOD: Lipase: 35 U/L (ref 11–59)

## 2014-03-13 LAB — CBC WITH DIFFERENTIAL/PLATELET
BASOS ABS: 0 10*3/uL (ref 0.0–0.1)
BASOS PCT: 1 % (ref 0–1)
EOS PCT: 1 % (ref 0–5)
Eosinophils Absolute: 0 10*3/uL (ref 0.0–0.7)
HEMATOCRIT: 40.1 % (ref 36.0–46.0)
HEMOGLOBIN: 13.1 g/dL (ref 12.0–15.0)
Lymphocytes Relative: 25 % (ref 12–46)
Lymphs Abs: 0.8 10*3/uL (ref 0.7–4.0)
MCH: 28.5 pg (ref 26.0–34.0)
MCHC: 32.7 g/dL (ref 30.0–36.0)
MCV: 87.4 fL (ref 78.0–100.0)
MONO ABS: 0.3 10*3/uL (ref 0.1–1.0)
MONOS PCT: 10 % (ref 3–12)
Neutro Abs: 2.1 10*3/uL (ref 1.7–7.7)
Neutrophils Relative %: 63 % (ref 43–77)
Platelets: 184 10*3/uL (ref 150–400)
RBC: 4.59 MIL/uL (ref 3.87–5.11)
RDW: 12.2 % (ref 11.5–15.5)
WBC: 3.3 10*3/uL — ABNORMAL LOW (ref 4.0–10.5)

## 2014-03-13 LAB — URINALYSIS, ROUTINE W REFLEX MICROSCOPIC
Bilirubin Urine: NEGATIVE
GLUCOSE, UA: NEGATIVE mg/dL
Hgb urine dipstick: NEGATIVE
KETONES UR: NEGATIVE mg/dL
LEUKOCYTES UA: NEGATIVE
NITRITE: NEGATIVE
PROTEIN: NEGATIVE mg/dL
Specific Gravity, Urine: 1.022 (ref 1.005–1.030)
Urobilinogen, UA: 1 mg/dL (ref 0.0–1.0)
pH: 8 (ref 5.0–8.0)

## 2014-03-13 LAB — COMPREHENSIVE METABOLIC PANEL
ALBUMIN: 4.2 g/dL (ref 3.5–5.2)
ALT: 6 U/L (ref 0–35)
AST: 14 U/L (ref 0–37)
Alkaline Phosphatase: 62 U/L (ref 39–117)
BILIRUBIN TOTAL: 0.4 mg/dL (ref 0.3–1.2)
BUN: 11 mg/dL (ref 6–23)
CALCIUM: 9.4 mg/dL (ref 8.4–10.5)
CHLORIDE: 101 meq/L (ref 96–112)
CO2: 27 meq/L (ref 19–32)
CREATININE: 0.77 mg/dL (ref 0.50–1.10)
GFR calc Af Amer: >90 mL/min (ref >90)
Glucose, Bld: 81 mg/dL (ref 70–99)
Potassium: 3.7 mEq/L (ref 3.7–5.3)
Sodium: 140 mEq/L (ref 137–147)
Total Protein: 7.2 g/dL (ref 6.0–8.3)

## 2014-03-13 LAB — POC URINE PREG, ED: PREG TEST UR: NEGATIVE

## 2014-03-13 MED ORDER — PROMETHAZINE HCL 25 MG PO TABS: 25.0000 mg | ORAL_TABLET | Freq: Four times a day (QID) | ORAL | Status: DC | PRN

## 2014-03-13 MED ORDER — RANITIDINE HCL 150 MG PO CAPS: 150.0000 mg | ORAL_CAPSULE | Freq: Every day | ORAL | Status: DC

## 2014-03-13 NOTE — ED Notes (Signed)
Pa  at bedside. 

## 2014-03-13 NOTE — ED Notes (Signed)
Per pt, states abdominal pain for 3 months-increased weakness and only able to eat one meal a day

## 2014-03-13 NOTE — ED Provider Notes (Signed)
Medical screening examination/treatment/procedure(s) were performed by non-physician practitioner and as supervising physician I was immediately available for consultation/collaboration.   EKG Interpretation None        Layla MawKristen N Abeni Finchum, DO 03/13/14 2343

## 2014-03-13 NOTE — ED Provider Notes (Signed)
CSN: [id number]     Arrival date & time 03/13/14  1342 History   First MD Initiated Contact with Patient 03/13/14 1548     Chief Complaint  Patient presents with  . Abdominal Pain     (Consider location/radiation/quality/duration/timing/severity/associated sxs/prior Treatment) HPI Pt is a 23yo female with hx of gastric ulcer, asthma, and anxiety c/o 32mo hx of increased generalized weakness with intermittent abdominal pain. Pt states she has only been able to eat one meal a day due to abdominal pain and weakness. Pt states about 40min after she eats she feels "weak and drained."  Abdominal pain comes and goes, described as an aching, sharp pain, usually in LUQ then radiating throughout abdomen, 6/10 at worst.  Pain often starts within and hour after she eats. States she has tried to cut out dairy products and states she does not eat a lot of fried fatty food but has not noticed any improvement in her symptoms. Denies taking any pain medications for symptoms. She has never been evaluated before for these symptoms.  Denies vomiting or diarrhea. Denies vaginal symptoms. Denies dysuria or hematuria, does report occasionally feeling as though she cannot fully empty her bladder but states after giving urine sample upon arrival to ED, everything was "fine." denies family hx of similar symptoms. Denies hx of abdominal surgeries.  LKMP: 01/24/14. Denies concern for pregnancy, however pt is not on birth control.   Past Medical History  Diagnosis Date  . Gastric ulcer   . Allergy   . Asthma   . Anxiety    Past Surgical History  Procedure Laterality Date  . Knee surgery     Family History  Problem Relation Age of Onset  . Fainting Sister    History  Substance Use Topics  . Smoking status: Never Smoker   . Smokeless tobacco: Not on file  . Alcohol Use: Yes     Comment: social   OB History   Grav Para Term Preterm Abortions TAB SAB Ect Mult Living                 Review of Systems   Constitutional: Positive for fever ( subjective-hot and cold chills), chills, appetite change and fatigue. Negative for diaphoresis and unexpected weight change.  Respiratory: Negative for shortness of breath.   Cardiovascular: Negative for chest pain.  Gastrointestinal: Positive for nausea and abdominal pain. Negative for vomiting, diarrhea and constipation.  Genitourinary: Positive for flank pain ( left) and decreased urine volume ( occasionally). Negative for dysuria, urgency, hematuria, vaginal discharge, vaginal pain and pelvic pain.  Musculoskeletal: Negative for back pain.  Neurological: Positive for weakness.  All other systems reviewed and are negative.     Allergies  Sulfa antibiotics  Home Medications   Prior to Admission medications   Medication Sig Start Date End Date Taking? Authorizing Provider  traMADol (ULTRAM) 50 MG tablet Take 1 tablet (50 mg total) by mouth every 6 (six) hours as needed. 12/13/13   Brandt LoosenJulie Manly, MD   BP 109/69  Pulse 77  Temp(Src) 98.7 F (37.1 C) (Oral)  Resp 16  SpO2 100%  LMP 01/24/2014 Physical Exam  Nursing note and vitals reviewed. Constitutional: She appears well-developed and well-nourished. No distress.  Soft, non-distended, non-tender. No CVAT  HENT:  Head: Normocephalic and atraumatic.  Eyes: Conjunctivae are normal. No scleral icterus.  Neck: Normal range of motion.  Cardiovascular: Normal rate, regular rhythm and normal heart sounds.   Pulmonary/Chest: Effort normal and breath sounds normal. No  respiratory distress. She has no wheezes. She has no rales. She exhibits no tenderness.  Abdominal: Soft. Bowel sounds are normal. She exhibits no distension and no mass. There is no tenderness. There is no rebound and no guarding.  Soft, non-distended, non-tender. No CVAT  Musculoskeletal: Normal range of motion.  Neurological: She is alert.  Skin: Skin is warm and dry. She is not diaphoretic.    ED Course  Procedures (including  critical care time) Labs Review Labs Reviewed  CBC WITH DIFFERENTIAL - Abnormal; Notable for the following:    WBC 3.3 (*)    All other components within normal limits  URINALYSIS, ROUTINE W REFLEX MICROSCOPIC - Abnormal; Notable for the following:    APPearance CLOUDY (*)    All other components within normal limits  COMPREHENSIVE METABOLIC PANEL  LIPASE, BLOOD  POC URINE PREG, ED    Imaging Review No results found.   EKG Interpretation None      MDM   Final diagnoses:  Abdominal pain  Weakness    Pt is a 23yo female with c/o abdominal pain with generalized weakness, no significant PMH.  On exam, pt appears well, non-toxic. Abd-soft, non-distended, non-tedner. Not concerned for acute abdomen.  Labs: CBC, CMP, Lipase, UA, urine preg: unremarkable.  Pt stated she had abdominal pain on arrival but it went away, declined pain or nausea medication.   Discussed pt with Dr. Elesa MassedWard, no evidence to recommend further evaluation with imaging at this time. Reassured pt, advised to [facility], may need GI referral. Return precautions provided. Pt verbalized understanding and agreement with tx plan.     Junius Finnerrin O'Malley, PA-C 03/13/14 1812

## 2014-03-13 NOTE — Discharge Instructions (Signed)
°Emergency Department Resource Guide °1) Find a Doctor and Pay Out of Pocket °Although you won't have to find out who is covered by your insurance plan, it is a good idea to ask around and get recommendations. You will then need to call the office and see if the doctor you have chosen will accept you as a new patient and what types of options they offer for patients who are self-pay. Some doctors offer discounts or will set up payment plans for their patients who do not have insurance, but you will need to ask so you aren't surprised when you get to your appointment. ° °2) Contact Your Local Health Department °Not all health departments have doctors that can see patients for sick visits, but many do, so it is worth a call to see if yours does. If you don't know where your local health department is, you can check in your phone book. The CDC also has a tool to help you locate your state's health department, and many state websites also have listings of all of their local health departments. ° °3) Find a Walk-in Clinic °If your illness is not likely to be very severe or complicated, you may want to try a walk in clinic. These are popping up all over the country in pharmacies, drugstores, and shopping centers. They're usually staffed by nurse practitioners or physician assistants that have been trained to treat common illnesses and complaints. They're usually fairly quick and inexpensive. However, if you have serious medical issues or chronic medical problems, these are probably not your best option. ° °No Primary Care Doctor: °- Call [facility] at  832-8000 - they can help you locate a primary care doctor that  accepts your insurance, provides certain services, etc. °- Physician Referral Service- 1-800-533-3463 ° °Chronic Pain Problems: °Organization         Address  Phone   Notes  ° [facility]  (336) 297-2271 Patients need to be referred by their primary care doctor.  ° °Medication  Assistance: °Organization         Address  Phone   Notes  °[place] [facility] [address] °[place], [place] [address] (336) 641-8030 --Must be a resident of [place] °-- Must have NO insurance coverage whatsoever (no Medicaid/ Medicare, etc.) °-- The pt. MUST have a primary care doctor that directs their care regularly and follows them in the community °  °[facility]  (866) 331-1348   °[employer]  (888) 892-1162   ° °Agencies that provide inexpensive medical care: °Organization         Address  Phone   Notes  °[facility]  (336) 832-8035   °[facility]    (336) 832-7272   °[facility] [address] °[place], [place] [address] (336) 832-4777   °[facility] [address], °[place] (336) 271-4999   °[facility]    (336) 373-0678   °[facility]    (336) 272-1050   °[facility] ° [address], [place] Phone:  (336) 832-4444, Fax:  (336) 832-4440 Hours of Operation:  9 am - 6 pm, M-F.  Also accepts Medicaid/Medicare and self-pay.  ° [facility] ° [address], [place] Phone: (336) 832-3150, Fax: (336) 832-3151. Hours of Operation:  8:30 am - 5:30 pm, M-F.  Also accepts Medicaid and self-pay.  °[facility] [place] [address], [place] Phone: (336) 878-6027   °[facility] [address], [place], [place] (336)723-1848, Ext. 123 Mondays & Thursdays: 7-9 AM.  First 15 patients are seen on a first come, first serve basis. °  ° °Medicaid-accepting [place] Providers: ° °Organization         Address  Phone   Notes  °[facility] [address], [place] (336) 641-2100 Also accepts self-pay patients.  °[facility] [address], [place] ° (336) 856-9996   °[facility] [address], [place]  (336) 288-8857   °[facility] [address] [place] [address], [place] (336) 299-7000   °Veita Bland [address], [place]  ° (336) 373-1557 Only accepts [place] Access Medicaid patients after they have their name applied to their card.  ° °Self-Pay (no insurance) in [place]: ° °Organization         Address  Phone   Notes  °Sickle Cell Patients, [facility] [address], [place] (336) 832-1970   °[facility] [address], [place] (336) 832-4400   °[facility] [place] ° [address], [place] (336) 992-4800   °[facility]/Dr. Osei-Bonsu ° [address] [place] [address], [place] or [address], [place] (336) 841-8500 Phone number for both [place] and [place] locations is the same.  °[facility] [address], [place] (336) 299-0000   °[facility] [address] [place] [address], [place] or [address] (336) 852-7530 °(336) 878-2260   °[facility] [address], [place] (336) 350-1642, phone; (336) 294-5005, fax Sees patients 1st and 3rd Saturday of every month.  Must not qualify for public or private insurance (i.e. Medicaid, Medicare, [place] Health Choice, Veterans' Benefits) • Household income should be no more than 200% of the poverty level •The clinic cannot treat you if you are pregnant or think you are pregnant • Sexually transmitted diseases are not treated at the clinic.  ° ° °Dental Care: °Organization         Address  Phone  Notes  °[place] [facility] [address], [place] (336) 641-6152 Accepts children up to age 21 who are enrolled in Medicaid or [place] Health Choice; pregnant women with a Medicaid card; and children who have applied for Medicaid or [place] Health Choice, but were declined, whose parents can pay a reduced fee at time of service.  °[place]  [facility] [place]  [address], [place] (336) 641-7733 Accepts children up to age 21 who are enrolled in Medicaid or [place] Health Choice; pregnant women with a Medicaid card; and children who have applied for Medicaid or [place] Health Choice, but were declined, whose parents can pay a reduced fee at time of service.  °[facility] ° [address],  (336) 641-4533 Patients are seen by appointment only. Walk-ins are not accepted. [facility] will see patients 18 years of age and older. °Monday - Tuesday (8am-5pm) °Most Wednesdays (8:30-5pm) °$30 per visit, cash only  °[facility] ° [address], [place] (336) 641-4533 Patients are seen by appointment only. Walk-ins are not accepted. [facility] will see patients 18 years of age and older. °One   Wednesday Evening (Monthly: Volunteer Based).  $30 per visit, cash only  °[facility]  (919) 537-3737 for adults; Children under age 4, call Graduate Pediatric Dentistry at (919) [phone]. Children aged 4-14, please call (919) [phone] to request a pediatric application. ° Dental services are provided in all areas of dental care including fillings, crowns and bridges, complete and partial dentures, implants, gum treatment, root canals, and extractions. Preventive care is also provided. Treatment is provided to both adults and children. °Patients are selected via a lottery and there is often a waiting list. °  °[facility] [address], °[place] ° (336) 763-8833 [email or web address] °  °[facility] [address], [place], [place] (336)723-1848, Ext. 123 Second and Fourth Thursday of each month, opens at 6:30 AM; Clinic ends at 9 AM.  Patients are seen on a first-come first-served basis, and a limited number are seen during each clinic.  ° °[facility] ° [address], [place], [place] (336) 723-7904    Eligibility Requirements °You must have lived in [place], [place], or [place] counties for at least the last three months. °  You cannot be eligible for state or federal sponsored healthcare insurance, including Veterans Administration, Medicaid, or Medicare. °  You generally cannot be eligible for healthcare insurance through your employer.  °  How to apply: °Eligibility screenings are held every Tuesday and Wednesday afternoon from 1:00 pm until 4:00 pm. You do not need an appointment for the interview!  °[facility] [address], [place], [place] 336-631-2330   °[facility]  336-342-8273   °[facility]  336-703-3100   °[facility]  336-570-6415   ° °Behavioral Health Resources in the Community: °Intensive Outpatient Programs °Organization         Address  Phone  Notes  °[facility] [address], [place], [place] 336-878-6098   °[facility] [address], [place], [place] 336-832-9800   °[facility] [address], [place], [place] ° 336-882-2125   °[facility] [address],  °[place], [place] 1-800-853-5163 or 336-641-4981   °Substance Abuse Resources °Organization         Address  Phone  Notes  °[facility]  336-882-2125   °[facility]  336-784-9470   °[facility]  336-285-9073   °[facility]  336-845-3988   °Residential & Outpatient Substance Abuse Program  1-800-659-3381   °Psychological Services °Organization         Address  Phone  Notes  °[facility]  336- 832-9600   °[facility]  336- 378-7881   °[facility] [address], [place] 1-800-853-5163 or 336-641-4981   ° °Mobile Crisis Teams °Organization         Address  Phone  Notes  °[facility], Mobile Crisis Care Unit  1-877-626-1772   °Assertive °[facility] ° [address]  [place], [place] 336-834-9664   °Sharon DeEsch [address] °[place] 336-554-5454   ° °Self-Help/Support Groups °Organization         Address  Phone             Notes  °[facility] - variety of support groups  336- 373-1402 Call for more information  °Narcotics Anonymous (NA), [facility] [address], °[place]  2 meetings at this location  ° °  Residential Treatment Programs °Organization         Address  Phone  Notes  °[facility] [address],    °[place]  1-866-801-8205   °[facility] ° [address], [place], [place] 704-293-8524   °[facility] [address], [place] 336-845-3988 Admissions: 8am-3pm M-F  °[facility] [address]    °[place], [place] 336-841-1104   °[facility] [address], [place], [place] 336-379-7146   °[facility] [address]  °[place], [place] 336-285-9073   °[facility] Intensive Outpatient [address], [place], [place] 336-852-3033   °[facility] [address]  °[place], [place] 1-877-615-2722 or 336-784-9470   °[facility] [address] [place], [place] 336-227-7417 Accepts Medicaid  °[facility] [address]  ° [place] 1-800-659-3381 Substance Abuse/Addiction Treatment  ° °[place] Behavioral Health Resources °Organization         Address  Phone  Notes  °[facility]  (888) 581-9988   °Julie Brannon, PhD [address] [place], [place]   (336) 349-5553 or (336) 951-0000   °[facility]   [address] °[place], [place] (336) 349-4454   °[facility] [address], [place], [place] (336) 342-8316 Insurance/Medicaid/sponsorship through [facility]  °[facility] [address]                                    [place], [place] (336) 342-8316 Therapy/tele-psych/case    °[facility] [address]  ° [place],  (336) 349-2233    °Dr. Arfeen  (336) 349-4544   °[facility] [place]  [employer] [place] [facility] 1) [address], [place] °2) [address], [place] °3)  [address], [place] (336) 349-3220 °(336) 342-7768 ° °(336) 342-8140   °[place] [facility] (336) 342-1394 or (336) 342-3537 (After Hours)    ° ° °

## 2014-03-13 NOTE — Progress Notes (Signed)
  CARE MANAGEMENT ED NOTE 03/13/2014  Patient:  Evelena PeatLEGRAND,Maryln   Account Number:  [id number]  Date Initiated:  03/13/2014  Documentation initiated by:  Radford PaxFERRERO,Kumar Falwell  Subjective/Objective Assessment:   Patient presents to Ed with abdominal pain and weakness.     Subjective/Objective Assessment Detail:   Patient with pmhx of gastric ulcer     Action/Plan:   Action/Plan Detail:   Anticipated DC Date:       Status Recommendation to Physician:   Result of Recommendation:    Other ED Services  Consult Working Plan    DC Planning Services  Other  PCP issues    Choice offered to / List presented to:            Status of service:  Completed, signed off  ED Comments:   ED Comments Detail:  EDCM spoke to patient at bedside.  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  Patient confirms she has BCBS and does not see the physician on campus.  EDCM instructed patient to call the phone  number on the back of her insurance card or go to [employer] company website to help her find a pcp who is close to her and within network.  Patient verbalized understanding.  No further EDCM needs at this time.

## 2014-03-13 NOTE — ED Notes (Signed)
Pt ambulating to restroom with steady gait however, states " I feel weak and drained".

## 2014-12-19 ENCOUNTER — Encounter (HOSPITAL_COMMUNITY): Payer: Self-pay | Admitting: Emergency Medicine

## 2014-12-19 ENCOUNTER — Emergency Department (HOSPITAL_COMMUNITY)
Admission: EM | Admit: 2014-12-19 | Discharge: 2014-12-19 | Disposition: A | Discharge status: Home / Self Care | Payer: BLUE CROSS/BLUE SHIELD | Attending: Emergency Medicine | Admitting: Emergency Medicine

## 2014-12-19 ENCOUNTER — Emergency Department (HOSPITAL_COMMUNITY): Payer: BLUE CROSS/BLUE SHIELD

## 2014-12-19 DIAGNOSIS — R1031 Right lower quadrant pain: Secondary | ICD-10-CM | POA: Insufficient documentation

## 2014-12-19 DIAGNOSIS — R509 Fever, unspecified: Secondary | ICD-10-CM | POA: Diagnosis not present

## 2014-12-19 DIAGNOSIS — R112 Nausea with vomiting, unspecified: Secondary | ICD-10-CM | POA: Diagnosis not present

## 2014-12-19 DIAGNOSIS — Z79899 Other long term (current) drug therapy: Secondary | ICD-10-CM | POA: Insufficient documentation

## 2014-12-19 DIAGNOSIS — Z8659 Personal history of other mental and behavioral disorders: Secondary | ICD-10-CM | POA: Diagnosis not present

## 2014-12-19 DIAGNOSIS — K259 Gastric ulcer, unspecified as acute or chronic, without hemorrhage or perforation: Secondary | ICD-10-CM | POA: Insufficient documentation

## 2014-12-19 DIAGNOSIS — R109 Unspecified abdominal pain: Secondary | ICD-10-CM

## 2014-12-19 DIAGNOSIS — Z3202 Encounter for pregnancy test, result negative: Secondary | ICD-10-CM | POA: Insufficient documentation

## 2014-12-19 DIAGNOSIS — R197 Diarrhea, unspecified: Secondary | ICD-10-CM | POA: Diagnosis not present

## 2014-12-19 DIAGNOSIS — J45909 Unspecified asthma, uncomplicated: Secondary | ICD-10-CM | POA: Insufficient documentation

## 2014-12-19 LAB — URINALYSIS, ROUTINE W REFLEX MICROSCOPIC
Bilirubin Urine: NEGATIVE
GLUCOSE, UA: NEGATIVE mg/dL
Hgb urine dipstick: NEGATIVE
Ketones, ur: NEGATIVE mg/dL
LEUKOCYTES UA: NEGATIVE
Nitrite: NEGATIVE
PROTEIN: NEGATIVE mg/dL
SPECIFIC GRAVITY, URINE: 1.027 (ref 1.005–1.030)
UROBILINOGEN UA: 1 mg/dL (ref 0.0–1.0)
pH: 7.5 (ref 5.0–8.0)

## 2014-12-19 LAB — COMPREHENSIVE METABOLIC PANEL
ALBUMIN: 4.4 g/dL (ref 3.5–5.2)
ALK PHOS: 56 U/L (ref 39–117)
ALT: 9 U/L (ref 0–35)
ANION GAP: 6 (ref 5–15)
AST: 19 U/L (ref 0–37)
BUN: 14 mg/dL (ref 6–23)
CHLORIDE: 107 mmol/L (ref 96–112)
CO2: 27 mmol/L (ref 19–32)
Calcium: 9.1 mg/dL (ref 8.4–10.5)
Creatinine, Ser: 0.63 mg/dL (ref 0.50–1.10)
GFR calc Af Amer: >90 mL/min (ref >90)
GFR calc non Af Amer: >90 mL/min (ref >90)
Glucose, Bld: 97 mg/dL (ref 70–99)
POTASSIUM: 3.7 mmol/L (ref 3.5–5.1)
SODIUM: 140 mmol/L (ref 135–145)
TOTAL PROTEIN: 7.4 g/dL (ref 6.0–8.3)
Total Bilirubin: 0.4 mg/dL (ref 0.3–1.2)

## 2014-12-19 LAB — CBC WITH DIFFERENTIAL/PLATELET
BASOS ABS: 0 10*3/uL (ref 0.0–0.1)
BASOS PCT: 0 % (ref 0–1)
EOS ABS: 0.1 10*3/uL (ref 0.0–0.7)
Eosinophils Relative: 2 % (ref 0–5)
HCT: 39.1 % (ref 36.0–46.0)
Hemoglobin: 12.4 g/dL (ref 12.0–15.0)
Lymphocytes Relative: 30 % (ref 12–46)
Lymphs Abs: 1.6 10*3/uL (ref 0.7–4.0)
MCH: 28.6 pg (ref 26.0–34.0)
MCHC: 31.7 g/dL (ref 30.0–36.0)
MCV: 90.3 fL (ref 78.0–100.0)
MONOS PCT: 10 % (ref 3–12)
Monocytes Absolute: 0.5 10*3/uL (ref 0.1–1.0)
NEUTROS ABS: 3.2 10*3/uL (ref 1.7–7.7)
NEUTROS PCT: 58 % (ref 43–77)
PLATELETS: 217 10*3/uL (ref 150–400)
RBC: 4.33 MIL/uL (ref 3.87–5.11)
RDW: 12.4 % (ref 11.5–15.5)
WBC: 5.5 10*3/uL (ref 4.0–10.5)

## 2014-12-19 LAB — WET PREP, GENITAL: Trich, Wet Prep: NONE SEEN

## 2014-12-19 LAB — POC URINE PREG, ED: Preg Test, Ur: NEGATIVE

## 2014-12-19 LAB — LIPASE, BLOOD: Lipase: 33 U/L (ref 11–59)

## 2014-12-19 MED ORDER — PROMETHAZINE HCL 25 MG PO TABS: 25.0000 mg | ORAL_TABLET | Freq: Four times a day (QID) | ORAL | Status: DC | PRN

## 2014-12-19 MED ORDER — PANTOPRAZOLE SODIUM 40 MG IV SOLR
40.0000 mg | Freq: Once | INTRAVENOUS | Status: AC
  Administered 2014-12-19: 40 mg via INTRAVENOUS
  Filled 2014-12-19: qty 40

## 2014-12-19 MED ORDER — ONDANSETRON HCL 4 MG/2ML IJ SOLN
4.0000 mg | Freq: Once | INTRAMUSCULAR | Status: AC
  Administered 2014-12-19: 4 mg via INTRAVENOUS
  Filled 2014-12-19: qty 2

## 2014-12-19 MED ORDER — GI COCKTAIL ~~LOC~~
30.0000 mL | Freq: Once | ORAL | Status: AC
  Administered 2014-12-19: 30 mL via ORAL
  Filled 2014-12-19: qty 30

## 2014-12-19 MED ORDER — PROMETHAZINE HCL 25 MG/ML IJ SOLN
25.0000 mg | Freq: Once | INTRAMUSCULAR | Status: AC
  Administered 2014-12-19: 25 mg via INTRAVENOUS
  Filled 2014-12-19: qty 1

## 2014-12-19 MED ORDER — IOHEXOL 300 MG/ML  SOLN
50.0000 mL | Freq: Once | INTRAMUSCULAR | Status: AC | PRN
  Administered 2014-12-19: 50 mL via ORAL

## 2014-12-19 MED ORDER — SODIUM CHLORIDE 0.9 % IV BOLUS (SEPSIS)
1000.0000 mL | Freq: Once | INTRAVENOUS | Status: AC
  Administered 2014-12-19: 1000 mL via INTRAVENOUS

## 2014-12-19 MED ORDER — IOHEXOL 300 MG/ML  SOLN
100.0000 mL | Freq: Once | INTRAMUSCULAR | Status: AC | PRN
  Administered 2014-12-19: 100 mL via INTRAVENOUS

## 2014-12-19 MED ORDER — MORPHINE SULFATE 4 MG/ML IJ SOLN
4.0000 mg | Freq: Once | INTRAMUSCULAR | Status: AC
  Administered 2014-12-19: 4 mg via INTRAVENOUS
  Filled 2014-12-19: qty 1

## 2014-12-19 MED ORDER — HYDROMORPHONE HCL 1 MG/ML IJ SOLN
1.0000 mg | Freq: Once | INTRAMUSCULAR | Status: AC
  Administered 2014-12-19: 1 mg via INTRAVENOUS
  Filled 2014-12-19: qty 1

## 2014-12-19 NOTE — ED Notes (Addendum)
Pt c/o dull, aching, RLQ abdominal pain, emesis, bloody diarrhea intermittent x 3 months, inability to eat sufficiently, last night pain changed to sharp, burning, hot pain, nausea, diarrhea, blood in diarrhea. Pt sent straight here from PCP, PCP suspects severe colitis or appendicitis, unsure of diagnosis. Rebound tenderness present on assessment.

## 2014-12-19 NOTE — ED Notes (Signed)
Troponin results given to dr Blinda Leatherwoodpollina...klj

## 2014-12-19 NOTE — ED Notes (Signed)
MD at bedside. 

## 2014-12-19 NOTE — ED Provider Notes (Signed)
CSN: [id number]     Arrival date & time 12/19/14  1811 History   First MD Initiated Contact with Patient 12/19/14 1844     Chief Complaint  Patient presents with  . Abdominal Pain     (Consider location/radiation/quality/duration/timing/severity/associated sxs/prior Treatment) HPI  24 year old female presents with right lower quadrant abdominal pain for the past 3 months. She states that originally it seemed only occur after eating but now has become more constant. Is a sharp, burning pain. Seems radiate up into the middle of her abdomen. Has had nausea and vomiting earlier this morning. Nauseated now but no vomiting currently. Occasionally has diarrhea, has also had blood in her diarrhea, most recently 3 weeks ago. No blood since. No melena. Has noticed blood when she wipes occasionally, but again not recently. Went to urgent care and they told her that she either had appendicitis or colitis and to come to the ER to get a CT scan. Patient's pain has been constant for several weeks to months, nothing seems to make it better but movement doubly makes it worse. No back symptoms. Had a fever last night, states a friend took her temperature but she does not know what it was. Has not had any vaginal bleeding or discharge.  Past Medical History  Diagnosis Date  . Gastric ulcer   . Allergy   . Asthma   . Anxiety    Past Surgical History  Procedure Laterality Date  . Knee surgery     Family History  Problem Relation Age of Onset  . Fainting Sister    History  Substance Use Topics  . Smoking status: Never Smoker   . Smokeless tobacco: Not on file  . Alcohol Use: Yes     Comment: social   OB History    No data available     Review of Systems  Constitutional: Positive for fever.  Gastrointestinal: Positive for nausea, vomiting, abdominal pain, diarrhea and blood in stool (3 weeks ago).  Genitourinary: Positive for vaginal bleeding (currently on period). Negative for dysuria, hematuria  and vaginal discharge.  Musculoskeletal: Negative for back pain.  All other systems reviewed and are negative.     Allergies  Sulfa antibiotics  Home Medications   Prior to Admission medications   Medication Sig Start Date End Date Taking? Authorizing Provider  albuterol (PROVENTIL HFA;VENTOLIN HFA) 108 (90 BASE) MCG/ACT inhaler Inhale 2 puffs into the lungs every 4 (four) hours as needed for wheezing or shortness of breath.   Yes Historical Provider, MD  ibuprofen (ADVIL,MOTRIN) 200 MG tablet Take 400 mg by mouth every 6 (six) hours as needed for moderate pain.   Yes Historical Provider, MD  promethazine (PHENERGAN) 25 MG tablet Take 1 tablet (25 mg total) by mouth every 6 (six) hours as needed for nausea or vomiting. Patient not taking: Reported on 12/19/2014 03/13/14   Junius Finner, PA-C  ranitidine (ZANTAC) 150 MG capsule Take 1 capsule (150 mg total) by mouth daily. Patient not taking: Reported on 12/19/2014 03/13/14   Junius Finner, PA-C   BP 139/88 mmHg  Pulse 89  Temp(Src) 98.2 F (36.8 C) (Oral)  Resp 20  SpO2 100%  LMP 12/18/2014 Physical Exam  Constitutional: She is oriented to person, place, and time. She appears well-developed and well-nourished.  HENT:  Head: Normocephalic and atraumatic.  Right Ear: External ear normal.  Left Ear: External ear normal.  Nose: Nose normal.  Eyes: Right eye exhibits no discharge. Left eye exhibits no discharge.  Cardiovascular: Normal  rate, regular rhythm and normal heart sounds.   Pulmonary/Chest: Effort normal and breath sounds normal.  Abdominal: Soft. There is tenderness in the right lower quadrant.  Genitourinary: Uterus is not enlarged and not tender. Cervix exhibits no motion tenderness and no discharge. Right adnexum displays no mass and no tenderness. Left adnexum displays no mass and no tenderness. There is bleeding in the vagina. No tenderness in the vagina.  Neurological: She is alert and oriented to person, place, and time.   Skin: Skin is warm and dry. She is not diaphoretic.  Nursing note and vitals reviewed.   ED Course  Procedures (including critical care time) Labs Review Labs Reviewed  WET PREP, GENITAL - Abnormal; Notable for the following:    Yeast Wet Prep HPF POC RARE (*)    Clue Cells Wet Prep HPF POC RARE (*)    WBC, Wet Prep HPF POC RARE (*)    All other components within normal limits  URINALYSIS, ROUTINE W REFLEX MICROSCOPIC - Abnormal; Notable for the following:    APPearance CLOUDY (*)    All other components within normal limits  CBC WITH DIFFERENTIAL/PLATELET  COMPREHENSIVE METABOLIC PANEL  LIPASE, BLOOD  POC URINE PREG, ED  GC/CHLAMYDIA PROBE AMP ([place])    Imaging Review Ct Abdomen Pelvis W Contrast  12/19/2014   CLINICAL DATA:  Right lower quadrant abdominal pain, bloody diarrhea for 3 months  EXAM: CT ABDOMEN AND PELVIS WITH CONTRAST  TECHNIQUE: Multidetector CT imaging of the abdomen and pelvis was performed using the standard protocol following bolus administration of intravenous contrast.  CONTRAST:  50mL OMNIPAQUE IOHEXOL 300 MG/ML SOLN, 100mL OMNIPAQUE IOHEXOL 300 MG/ML SOLN  COMPARISON:  None.  FINDINGS: Lung bases are clear.  Liver and gallbladder are normal.  Pancreas is normal.  Spleen is normal.  Adrenal glands and kidneys appear normal.  Stomach and bowel are unremarkable.  Normal-appearing appendix.  No lymphadenopathy.  No free air or fluid.  The bladder is normal.  Uterus and ovaries appear normal. Trace pelvic free fluid is likely physiologic.  No acute osseus abnormality.  IMPRESSION: No acute intra-abdominal or pelvic pathology.   Electronically Signed   By: Christiana PellantGretchen  Green M.D.   On: 12/19/2014 21:10     EKG Interpretation None      MDM   Final diagnoses:  Abdominal pain in female patient    Patient's abdominal pain is of unclear etiology. Pelvic exam shows no tenderness, swelling, or discharge. I'm not able to reproduce her pain and thus I have low  suspicion this is of pelvic etiology. CT imaging shows no acute pathology. Normal appendix. Patient is well-appearing here, symptoms controlled. I do not know what the etiology of her pain is but I feel she be managed as an outpatient. Will refer to both PCP and a GI doctor. Patient declines pain prescription but will take Phenergan for nausea. Low suspicion for torsion, cholecystitis, appendicitis, or obstruction. She has had bloody bowel movements in the past but has not had in the last 3 weeks. I do not feel this is related.   Audree CamelScott T Tiffaney Heimann, MD 12/20/14 0001

## 2014-12-19 NOTE — Discharge Instructions (Signed)
Abdominal Pain, Women °Abdominal (stomach, pelvic, or belly) pain can be caused by many things. It is important to tell your doctor: °· The location of the pain. °· Does it come and go or is it present all the time? °· Are there things that start the pain (eating certain foods, exercise)? °· Are there other symptoms associated with the pain (fever, nausea, vomiting, diarrhea)? °All of this is helpful to know when trying to find the cause of the pain. °CAUSES  °· Stomach: virus or bacteria infection, or ulcer. °· Intestine: appendicitis (inflamed appendix), regional ileitis (Crohn's disease), ulcerative colitis (inflamed colon), irritable bowel syndrome, diverticulitis (inflamed diverticulum of the colon), or cancer of the stomach or intestine. °· Gallbladder disease or stones in the gallbladder. °· Kidney disease, kidney stones, or infection. °· Pancreas infection or cancer. °· Fibromyalgia (pain disorder). °· Diseases of the female organs: °¨ Uterus: fibroid (non-cancerous) tumors or infection. °¨ Fallopian tubes: infection or tubal pregnancy. °¨ Ovary: cysts or tumors. °¨ Pelvic adhesions (scar tissue). °¨ Endometriosis (uterus lining tissue growing in the pelvis and on the pelvic organs). °¨ Pelvic congestion syndrome (female organs filling up with blood just before the menstrual period). °¨ Pain with the menstrual period. °¨ Pain with ovulation (producing an egg). °¨ Pain with an IUD (intrauterine device, birth control) in the uterus. °¨ Cancer of the female organs. °· Functional pain (pain not caused by a disease, may improve without treatment). °· Psychological pain. °· Depression. °DIAGNOSIS  °Your doctor will decide the seriousness of your pain by doing an examination. °· Blood tests. °· X-rays. °· Ultrasound. °· CT scan (computed tomography, special type of X-ray). °· MRI (magnetic resonance imaging). °· Cultures, for infection. °· Barium enema (dye inserted in the large intestine, to better view it with  X-rays). °· Colonoscopy (looking in intestine with a lighted tube). °· Laparoscopy (minor surgery, looking in abdomen with a lighted tube). °· Major abdominal exploratory surgery (looking in abdomen with a large incision). °TREATMENT  °The treatment will depend on the cause of the pain.  °· Many cases can be observed and treated at home. °· Over-the-counter medicines recommended by your caregiver. °· Prescription medicine. °· Antibiotics, for infection. °· Birth control pills, for painful periods or for ovulation pain. °· Hormone treatment, for endometriosis. °· Nerve blocking injections. °· Physical therapy. °· Antidepressants. °· Counseling with a psychologist or psychiatrist. °· Minor or major surgery. °HOME CARE INSTRUCTIONS  °· Do not take laxatives, unless directed by your caregiver. °· Take over-the-counter pain medicine only if ordered by your caregiver. Do not take aspirin because it can cause an upset stomach or bleeding. °· Try a clear liquid diet (broth or water) as ordered by your caregiver. Slowly move to a bland diet, as tolerated, if the pain is related to the stomach or intestine. °· Have a thermometer and take your temperature several times a day, and record it. °· Bed rest and sleep, if it helps the pain. °· Avoid sexual intercourse, if it causes pain. °· Avoid stressful situations. °· Keep your follow-up appointments and tests, as your caregiver orders. °· If the pain does not go away with medicine or surgery, you may try: °¨ Acupuncture. °¨ Relaxation exercises (yoga, meditation). °¨ Group therapy. °¨ Counseling. °SEEK MEDICAL CARE IF:  °· You notice certain foods cause stomach pain. °· Your home care treatment is not helping your pain. °· You need stronger pain medicine. °· You want your IUD removed. °· You feel faint or   lightheaded. °· You develop nausea and vomiting. °· You develop a rash. °· You are having side effects or an allergy to your medicine. °SEEK IMMEDIATE MEDICAL CARE IF:  °· Your  pain does not go away or gets worse. °· You have a fever. °· Your pain is felt only in portions of the abdomen. The right side could possibly be appendicitis. The left lower portion of the abdomen could be colitis or diverticulitis. °· You are passing blood in your stools (bright red or black tarry stools, with or without vomiting). °· You have blood in your urine. °· You develop chills, with or without a fever. °· You pass out. °MAKE SURE YOU:  °· Understand these instructions. °· Will watch your condition. °· Will get help right away if you are not doing well or get worse. °Document Released: 08/31/2007 Document Revised: 03/20/2014 Document Reviewed: 09/20/2009 °Exit[employer]® Patient Information ©2015 [employer]. This information is not intended to replace advice given to you by your health care provider. Make sure you discuss any questions you have with your health care provider. °Diarrhea °Diarrhea is frequent loose and watery bowel movements. It can cause you to feel weak and dehydrated. Dehydration can cause you to become tired and thirsty, have a dry mouth, and have decreased urination that often is dark yellow. Diarrhea is a sign of another problem, most often an infection that will not last long. In most cases, diarrhea typically lasts 2-3 days. However, it can last longer if it is a sign of something more serious. It is important to treat your diarrhea as directed by your caregiver to lessen or prevent future episodes of diarrhea. °CAUSES  °Some common causes include: °· Gastrointestinal infections caused by viruses, bacteria, or parasites. °· Food poisoning or food allergies. °· Certain medicines, such as antibiotics, chemotherapy, and laxatives. °· Artificial sweeteners and fructose. °· Digestive disorders. °HOME CARE INSTRUCTIONS °· Ensure adequate fluid intake (hydration): Have 1 cup (8 oz) of fluid for each diarrhea episode. Avoid fluids that contain simple sugars or sports drinks, fruit juices, whole  milk products, and sodas. Your urine should be clear or pale yellow if you are drinking enough fluids. Hydrate with an oral rehydration solution that you can purchase at pharmacies, retail stores, and online. You can prepare an oral rehydration solution at home by mixing the following ingredients together: °¨  - tsp table salt. °¨ ¾ tsp baking soda. °¨  tsp salt substitute containing potassium chloride. °¨ 1  tablespoons sugar. °¨ 1 L (34 oz) of water. °· Certain foods and beverages may increase the speed at which food moves through the gastrointestinal (GI) tract. These foods and beverages should be avoided and include: °¨ Caffeinated and alcoholic beverages. °¨ High-fiber foods, such as raw fruits and vegetables, nuts, seeds, and whole grain breads and cereals. °¨ Foods and beverages sweetened with sugar alcohols, such as xylitol, sorbitol, and mannitol. °· Some foods may be well tolerated and may help thicken stool including: °¨ Starchy foods, such as rice, toast, pasta, low-sugar cereal, oatmeal, grits, baked potatoes, crackers, and bagels. °¨ Bananas. °¨ Applesauce. °· Add probiotic-rich foods to help increase healthy bacteria in the GI tract, such as yogurt and fermented milk products. °· Wash your hands well after each diarrhea episode. °· Only take over-the-counter or prescription medicines as directed by your caregiver. °· Take a warm bath to relieve any burning or pain from frequent diarrhea episodes. °SEEK IMMEDIATE MEDICAL CARE IF:  °· You are unable to keep   fluids down. °· You have persistent vomiting. °· You have blood in your stool, or your stools are black and tarry. °· You do not urinate in 6-8 hours, or there is only a small amount of very dark urine. °· You have abdominal pain that increases or localizes. °· You have weakness, dizziness, confusion, or light-headedness. °· You have a severe headache. °· Your diarrhea gets worse or does not get better. °· You have a fever or persistent symptoms for  more than 2-3 days. °· You have a fever and your symptoms suddenly get worse. °MAKE SURE YOU:  °· Understand these instructions. °· Will watch your condition. °· Will get help right away if you are not doing well or get worse. °Document Released: 10/24/2002 Document Revised: 03/20/2014 Document Reviewed: 07/11/2012 °ExitCare® Patient Information ©2015 [employer]. This information is not intended to replace advice given to you by your health care provider. Make sure you discuss any questions you have with your health care provider. ° °

## 2014-12-20 LAB — GC/CHLAMYDIA PROBE AMP (~~LOC~~) NOT AT ARMC
CHLAMYDIA, DNA PROBE: NEGATIVE
Neisseria Gonorrhea: NEGATIVE

## 2014-12-21 ENCOUNTER — Encounter: Payer: Self-pay | Admitting: Internal Medicine

## 2015-02-28 ENCOUNTER — Ambulatory Visit: Payer: BLUE CROSS/BLUE SHIELD | Admitting: Internal Medicine

## 2015-09-13 ENCOUNTER — Encounter (HOSPITAL_COMMUNITY): Payer: Self-pay | Admitting: Emergency Medicine

## 2015-09-13 ENCOUNTER — Emergency Department (HOSPITAL_COMMUNITY)
Admission: EM | Admit: 2015-09-13 | Discharge: 2015-09-13 | Disposition: A | Discharge status: Home / Self Care | Payer: BLUE CROSS/BLUE SHIELD | Attending: Emergency Medicine | Admitting: Emergency Medicine

## 2015-09-13 ENCOUNTER — Emergency Department (HOSPITAL_COMMUNITY): Payer: BLUE CROSS/BLUE SHIELD

## 2015-09-13 DIAGNOSIS — R1011 Right upper quadrant pain: Secondary | ICD-10-CM | POA: Diagnosis not present

## 2015-09-13 DIAGNOSIS — K259 Gastric ulcer, unspecified as acute or chronic, without hemorrhage or perforation: Secondary | ICD-10-CM | POA: Diagnosis not present

## 2015-09-13 DIAGNOSIS — Z79899 Other long term (current) drug therapy: Secondary | ICD-10-CM | POA: Diagnosis not present

## 2015-09-13 DIAGNOSIS — Z3202 Encounter for pregnancy test, result negative: Secondary | ICD-10-CM | POA: Diagnosis not present

## 2015-09-13 DIAGNOSIS — Z8659 Personal history of other mental and behavioral disorders: Secondary | ICD-10-CM | POA: Insufficient documentation

## 2015-09-13 DIAGNOSIS — J45909 Unspecified asthma, uncomplicated: Secondary | ICD-10-CM | POA: Insufficient documentation

## 2015-09-13 LAB — LIPASE, BLOOD: Lipase: 33 U/L (ref 11–51)

## 2015-09-13 LAB — URINALYSIS, ROUTINE W REFLEX MICROSCOPIC
Bilirubin Urine: NEGATIVE
GLUCOSE, UA: NEGATIVE mg/dL
Hgb urine dipstick: NEGATIVE
KETONES UR: NEGATIVE mg/dL
Leukocytes, UA: NEGATIVE
Nitrite: NEGATIVE
PH: 6.5 (ref 5.0–8.0)
Protein, ur: NEGATIVE mg/dL
Specific Gravity, Urine: 1.023 (ref 1.005–1.030)
Urobilinogen, UA: 0.2 mg/dL (ref 0.0–1.0)

## 2015-09-13 LAB — COMPREHENSIVE METABOLIC PANEL
ALT: 8 U/L — AB (ref 14–54)
AST: 18 U/L (ref 15–41)
Albumin: 4.5 g/dL (ref 3.5–5.0)
Alkaline Phosphatase: 51 U/L (ref 38–126)
Anion gap: 7 (ref 5–15)
BUN: 14 mg/dL (ref 6–20)
CO2: 25 mmol/L (ref 22–32)
CREATININE: 0.55 mg/dL (ref 0.44–1.00)
Calcium: 9 mg/dL (ref 8.9–10.3)
Chloride: 109 mmol/L (ref 101–111)
GFR calc Af Amer: >60 mL/min (ref >60)
GFR calc non Af Amer: >60 mL/min (ref >60)
GLUCOSE: 95 mg/dL (ref 65–99)
Potassium: 3.6 mmol/L (ref 3.5–5.1)
Sodium: 141 mmol/L (ref 135–145)
Total Bilirubin: 0.5 mg/dL (ref 0.3–1.2)
Total Protein: 7.2 g/dL (ref 6.5–8.1)

## 2015-09-13 LAB — CBC
HCT: 37.5 % (ref 36.0–46.0)
Hemoglobin: 12.2 g/dL (ref 12.0–15.0)
MCH: 29 pg (ref 26.0–34.0)
MCHC: 32.5 g/dL (ref 30.0–36.0)
MCV: 89.3 fL (ref 78.0–100.0)
PLATELETS: 187 10*3/uL (ref 150–400)
RBC: 4.2 MIL/uL (ref 3.87–5.11)
RDW: 12.1 % (ref 11.5–15.5)
WBC: 4.5 10*3/uL (ref 4.0–10.5)

## 2015-09-13 LAB — I-STAT BETA HCG BLOOD, ED (MC, WL, AP ONLY): I-stat hCG, quantitative: <5 m[IU]/mL (ref <5)

## 2015-09-13 MED ORDER — FENTANYL CITRATE (PF) 100 MCG/2ML IJ SOLN
50.0000 ug | Freq: Once | INTRAMUSCULAR | Status: AC
  Administered 2015-09-13: 50 ug via INTRAVENOUS
  Filled 2015-09-13: qty 2

## 2015-09-13 MED ORDER — PROMETHAZINE HCL 25 MG PO TABS: 25.0000 mg | ORAL_TABLET | Freq: Four times a day (QID) | ORAL | Status: AC | PRN

## 2015-09-13 MED ORDER — OMEPRAZOLE 20 MG PO CPDR: 20.0000 mg | DELAYED_RELEASE_CAPSULE | Freq: Two times a day (BID) | ORAL | Status: AC

## 2015-09-13 MED ORDER — IOHEXOL 300 MG/ML  SOLN
100.0000 mL | Freq: Once | INTRAMUSCULAR | Status: AC | PRN
  Administered 2015-09-13: 100 mL via INTRAVENOUS

## 2015-09-13 MED ORDER — IOHEXOL 300 MG/ML  SOLN
25.0000 mL | Freq: Once | INTRAMUSCULAR | Status: AC | PRN
  Administered 2015-09-13: 25 mL via ORAL

## 2015-09-13 MED ORDER — ONDANSETRON HCL 4 MG/2ML IJ SOLN
4.0000 mg | Freq: Once | INTRAMUSCULAR | Status: AC
  Administered 2015-09-13: 4 mg via INTRAVENOUS
  Filled 2015-09-13: qty 2

## 2015-09-13 MED ORDER — HYDROMORPHONE HCL 1 MG/ML IJ SOLN
0.5000 mg | Freq: Once | INTRAMUSCULAR | Status: AC
  Administered 2015-09-13: 0.5 mg via INTRAVENOUS
  Filled 2015-09-13: qty 1

## 2015-09-13 MED ORDER — RANITIDINE HCL 150 MG PO CAPS: 150.0000 mg | ORAL_CAPSULE | Freq: Every day | ORAL | Status: AC

## 2015-09-13 MED ORDER — SODIUM CHLORIDE 0.9 % IV BOLUS (SEPSIS)
1000.0000 mL | Freq: Once | INTRAVENOUS | Status: AC
  Administered 2015-09-13: 1000 mL via INTRAVENOUS

## 2015-09-13 MED ORDER — MORPHINE SULFATE (PF) 4 MG/ML IV SOLN
4.0000 mg | Freq: Once | INTRAVENOUS | Status: AC
  Administered 2015-09-13: 4 mg via INTRAVENOUS
  Filled 2015-09-13: qty 1

## 2015-09-13 NOTE — ED Notes (Signed)
Pt states that she has had RUQ pain x 3 days that is making her nauseated. Has a hx of 'gallbladder problems' but thought it had gotten better. Alert and oriented.

## 2015-09-13 NOTE — ED Notes (Signed)
AVS explained in detail. No other c/c. Knows to follow up with Gastroenterology as soon as possible. Knows not to drink alcoholic beverages tonight d/t all medications given here and knows not to combine Phenergan with alcohol. Acknowledges understanding. Ambulatory. Friend is driving patient home.

## 2015-09-13 NOTE — ED Provider Notes (Signed)
CSN: [id number]     Arrival date & time 09/13/15  1645 History   First MD Initiated Contact with Patient 09/13/15 1722     Chief Complaint  Patient presents with  . Abdominal Pain     (Consider location/radiation/quality/duration/timing/severity/associated sxs/prior Treatment) HPI   24 year old female with history of asthma, anxiety, gastric ulcer who presents for evaluation of abdominal pain. Patient report for the past 3 days she has had intermittent right upper quadrant abdominal pain. She described pain as a sharp sensation with occasional burning sensation that radiates towards her back. Pain is waxing waning and currently rated as a 4 out of 10. She reported nausea, has had vomitus with bilious content but nonbloody. She also noticed that her stool is light in color and her urine is a bit darker in color. She endorse chills without fever. She felt eating occasionally makes the pain worse. She denies any specific treatment tried. She reportedly has had similar pain like this in the summertime and was seen at an outside hospital. At that time she was told that she may have gallbladder problems but the gallbladder was not inflamed therefore no surgical intervention at that time. She is not a diabetic and reports occasional alcohol use none recently. No fever, headache, chest pain, shortness of breath, productive cough, dysuria, hematuria or rash. Her menstrual period started yesterday.  Past Medical History  Diagnosis Date  . Gastric ulcer   . Allergy   . Asthma   . Anxiety    Past Surgical History  Procedure Laterality Date  . Knee surgery     Family History  Problem Relation Age of Onset  . Fainting Sister    Social History  Substance Use Topics  . Smoking status: Never Smoker   . Smokeless tobacco: None  . Alcohol Use: Yes     Comment: social   OB History    No data available     Review of Systems  All other systems reviewed and are negative.     Allergies  Sulfa  antibiotics  Home Medications   Prior to Admission medications   Medication Sig Start Date End Date Taking? Authorizing Provider  albuterol (PROVENTIL HFA;VENTOLIN HFA) 108 (90 BASE) MCG/ACT inhaler Inhale 2 puffs into the lungs every 4 (four) hours as needed for wheezing or shortness of breath.    Historical Provider, MD  ibuprofen (ADVIL,MOTRIN) 200 MG tablet Take 400 mg by mouth every 6 (six) hours as needed for moderate pain.    Historical Provider, MD  promethazine (PHENERGAN) 25 MG tablet Take 1 tablet (25 mg total) by mouth every 6 (six) hours as needed for nausea or vomiting. 12/19/14   Pricilla Loveless, MD  ranitidine (ZANTAC) 150 MG capsule Take 1 capsule (150 mg total) by mouth daily. Patient not taking: Reported on 12/19/2014 03/13/14   Junius Finner, PA-C   BP 146/83 mmHg  Pulse 78  Temp(Src) 98.4 F (36.9 C) (Oral)  Resp 16  SpO2 100%  LMP 09/12/2015 Physical Exam  Constitutional: She appears well-developed and well-nourished. No distress.  HENT:  Head: Atraumatic.  Eyes: Conjunctivae are normal.  Neck: Neck supple.  Cardiovascular: Normal rate and regular rhythm.   Pulmonary/Chest: Effort normal and breath sounds normal.  Abdominal: Soft. Bowel sounds are normal. She exhibits no distension. There is tenderness (Right upper quadrant tenderness on palpation without guarding or rebound tenderness. Negative Murphy sign, no pain at McBurney's point. No peritoneal sign.).  Genitourinary:  No CVA tenderness  Neurological: She is  alert.  Skin: No rash noted.  Psychiatric: She has a normal mood and affect.  Nursing note and vitals reviewed.   ED Course  Angiocath insertion Date/Time: 09/13/2015 5:55 PM Performed by: Fayrene HelperRAN, Allyse Fregeau Authorized by: Fayrene HelperRAN, Sahra Converse Consent: Verbal consent obtained. Consent given by: patient Patient identity confirmed: verbally with patient Local anesthesia used: no Patient sedated: no Comments: Peripheral US guided IV access to L forearm    (including critical care time)  Patient here with right upper quadrant abdominal pain. Symptoms suggestive of biliary disease vs. Gastritis. Workup initiated. IV access was performed by me.  7:41 PM abd us showing no acute pathology.  Pt still endorsing abd pain, not adequately treated with morphine and fentanyl. Will give 0.5mg  of dilaudid and will obtain abd/pelvis CT scan for further evaluation.  Her labs are reassuring.    10:34 PM Pain is now resolved. Abdominal and pelvis CT scan shows no acute abnormalities. A tampon is noted on CT scan, pt is aware and sts she is currently on her menstruation. I encouraged patient to follow-up with the GI specialist for further evaluation of her condition. She does have history of peptic ulcer disease, not currently taking anything for that. Patient will be discharge with H2 blocker and PPI. Strict return precautions discussed.  Labs Review Labs Reviewed  COMPREHENSIVE METABOLIC PANEL - Abnormal; Notable for the following:    ALT 8 (*)    All other components within normal limits  URINALYSIS, ROUTINE W REFLEX MICROSCOPIC (NOT AT [facility]) - Abnormal; Notable for the following:    APPearance CLOUDY (*)    All other components within normal limits  LIPASE, BLOOD  CBC  I-STAT BETA HCG BLOOD, ED ([facility], [facility], [facility] ONLY)  POC URINE PREG, ED    Imaging Review [place] Abdomen Complete  09/13/2015  CLINICAL DATA:  Right upper quadrant pain. Symptoms for 3 days. Nausea. History of gastric ulcer. EXAM: ULTRASOUND ABDOMEN COMPLETE COMPARISON:  12/19/2014 FINDINGS: Gallbladder: Gallbladder has a normal appearance. Gallbladder wall is 1.8 mm, within normal limits. No stones or pericholecystic fluid. No sonographic Murphy's sign. Common bile duct: Diameter: 2.3 mm Liver: No focal lesion identified. Within normal limits in parenchymal echogenicity. IVC: No abnormality visualized. Pancreas: Visualized portion unremarkable. Spleen: Size and appearance within normal limits. Right  Kidney: Length: 10.4 cm. Echogenicity within normal limits. No mass or hydronephrosis visualized. Left Kidney: Length: 10.6 cm. Echogenicity within normal limits. No mass or hydronephrosis visualized. Abdominal aorta: No aneurysm visualized. Other findings: None. IMPRESSION: Normal evaluation of the abdomen. Electronically Signed   By: Norva PavlovElizabeth  Brown M.D.   On: 09/13/2015 19:21   Ct Abdomen Pelvis W Contrast  09/13/2015  CLINICAL DATA:  Acute onset of right upper quadrant abdominal pain and nausea. Initial encounter. EXAM: CT ABDOMEN AND PELVIS WITH CONTRAST TECHNIQUE: Multidetector CT imaging of the abdomen and pelvis was performed using the standard protocol following bolus administration of intravenous contrast. CONTRAST:  100mL OMNIPAQUE IOHEXOL 300 MG/ML  SOLN COMPARISON:  CT of the abdomen and pelvis performed 12/19/2014, abdominal ultrasound performed earlier today at 6:38 p.m. FINDINGS: The visualized lung bases are clear. The liver and spleen are unremarkable in appearance. The gallbladder is within normal limits. The pancreas and adrenal glands are unremarkable. The kidneys are unremarkable in appearance. There is no evidence of hydronephrosis. No renal or ureteral stones are seen. No perinephric stranding is appreciated. The small bowel is unremarkable in appearance. The stomach is within normal limits. No acute vascular abnormalities are seen. The appendix  is normal in caliber and contains air, without evidence of appendicitis. The colon is unremarkable in appearance. The bladder is mildly distended and grossly unremarkable in appearance. The uterus is grossly unremarkable. The ovaries are grossly symmetric. No suspicious adnexal masses are seen. Trace free fluid within the pelvis is likely physiologic. A tampon is noted at the vagina. No inguinal lymphadenopathy is seen. No acute osseous abnormalities are identified. IMPRESSION: Unremarkable contrast-enhanced CT of the abdomen and pelvis.  Electronically Signed   By: Roanna Raider M.D.   On: 09/13/2015 21:51   I have personally reviewed and evaluated these images and lab results as part of my medical decision-making.   EKG Interpretation None      MDM   Final diagnoses:  Right upper quadrant pain    BP 107/63 mmHg  Pulse 85  Temp(Src) 98.4 F (36.9 C) (Oral)  Resp 18  SpO2 100%  LMP 09/12/2015     Fayrene Helper, PA-C 09/13/15 1610  Marily Memos, MD 09/14/15 1103

## 2015-09-13 NOTE — Discharge Instructions (Signed)

## 2015-09-13 NOTE — ED Notes (Signed)
Patient transported to CT 

## 2015-12-31 ENCOUNTER — Other Ambulatory Visit: Payer: Self-pay | Admitting: Family Medicine

## 2015-12-31 DIAGNOSIS — R109 Unspecified abdominal pain: Secondary | ICD-10-CM

## 2016-01-01 ENCOUNTER — Other Ambulatory Visit: Payer: BLUE CROSS/BLUE SHIELD

## 2016-01-04 ENCOUNTER — Ambulatory Visit
Admission: RE | Admit: 2016-01-04 | Discharge: 2016-01-04 | Disposition: A | Discharge status: Home / Self Care | Payer: BLUE CROSS/BLUE SHIELD | Source: Ambulatory Visit | Attending: Family Medicine | Admitting: Family Medicine

## 2016-01-04 DIAGNOSIS — R109 Unspecified abdominal pain: Secondary | ICD-10-CM

## 2016-01-18 IMAGING — CT CT ABD-PELV W/ CM
2 of 4 series · 16 of 46 positions shown, 18 images · IV contrast (OMNIPAQUE 300)
Comparison: CT of the abdomen and pelvis performed 12/19/2014,
abdominal ultrasound performed earlier today at [DATE] p.m.

CLINICAL DATA: Acute onset of right upper quadrant abdominal pain
and nausea. Initial encounter.

EXAM:
CT ABDOMEN AND PELVIS WITH CONTRAST
TECHNIQUE: Multidetector CT imaging of the abdomen and pelvis was performed
using the standard protocol following bolus administration of
intravenous contrast.
CONTRAST:  100mL OMNIPAQUE IOHEXOL 300 MG/ML  SOLN

[Series 2: abd/pel with · axial · 0.71mm/px · z∈[+974,+1379]mm · 13 of 89 slices shown, 15 images]
[im 4/89  soft-tissue]
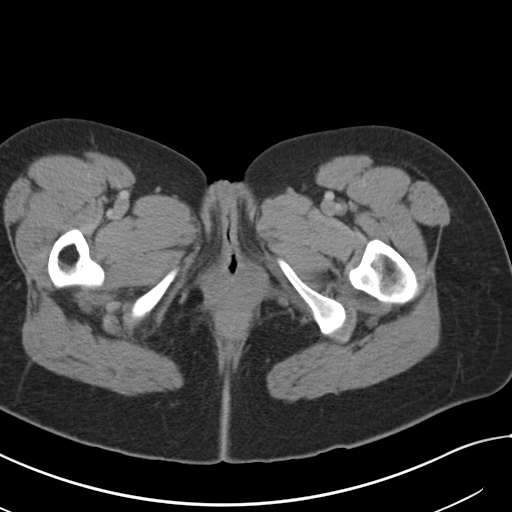
[im 4/89  bone]
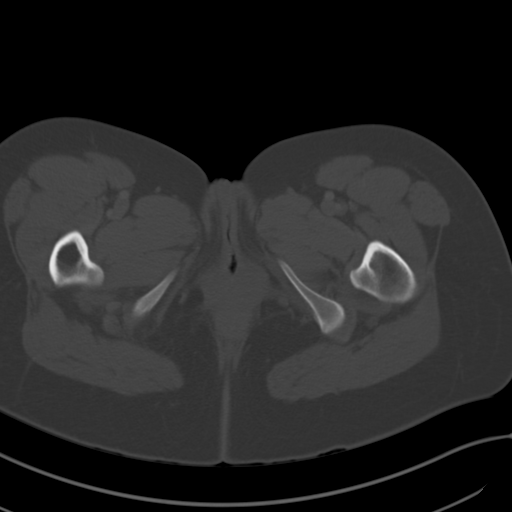
[im 12/89  soft-tissue]
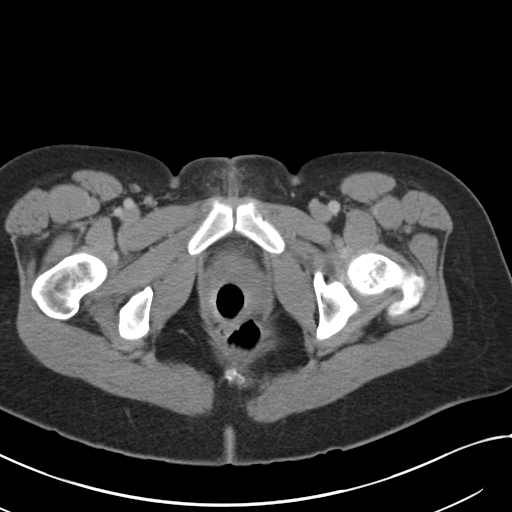
[im 19/89  soft-tissue]
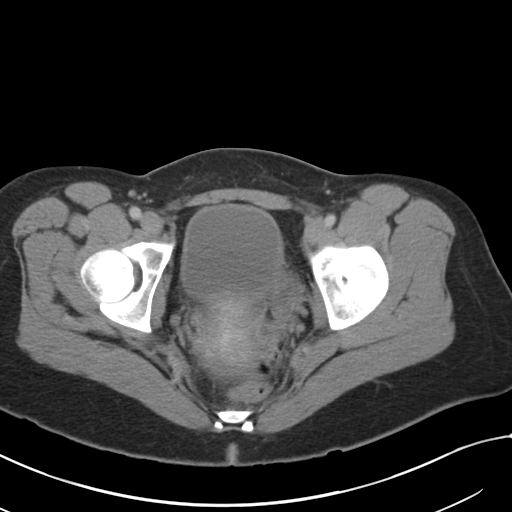
[im 26/89  soft-tissue]
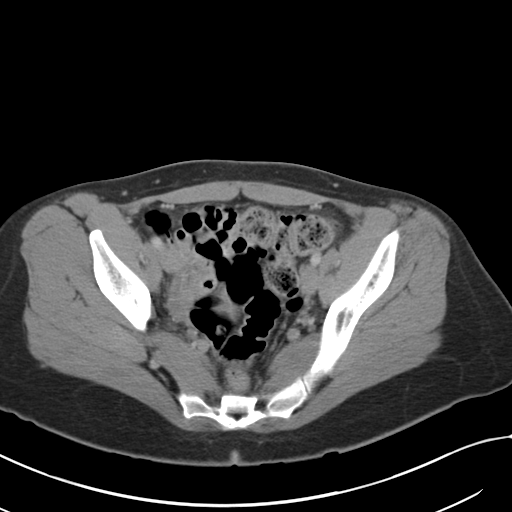
[im 30/89  soft-tissue]
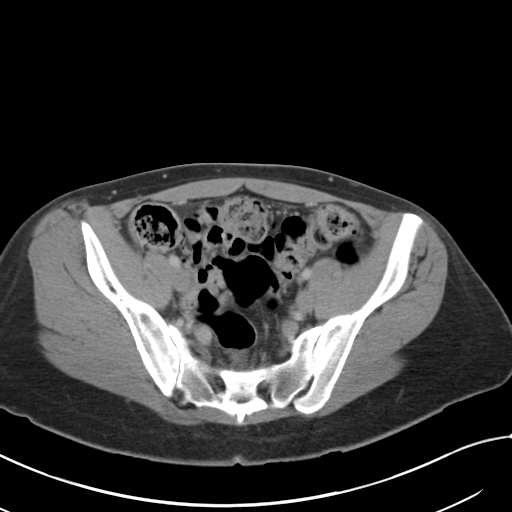
[im 37/89  soft-tissue]
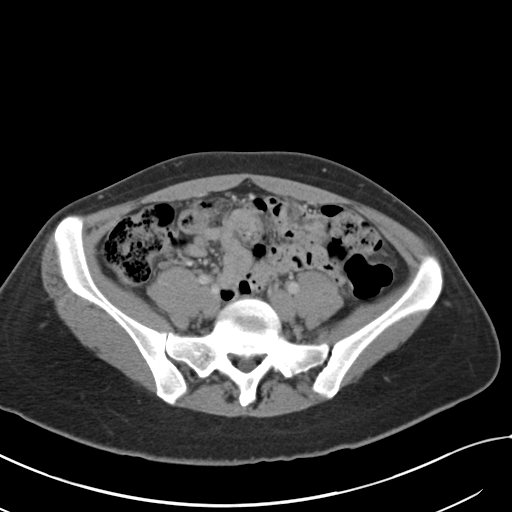
[im 45/89  soft-tissue]
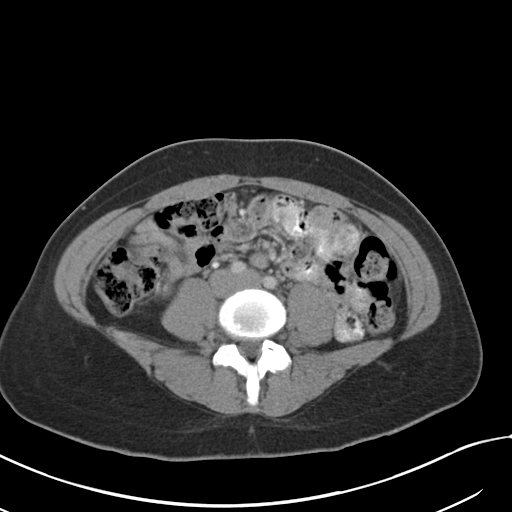
[im 52/89  soft-tissue]
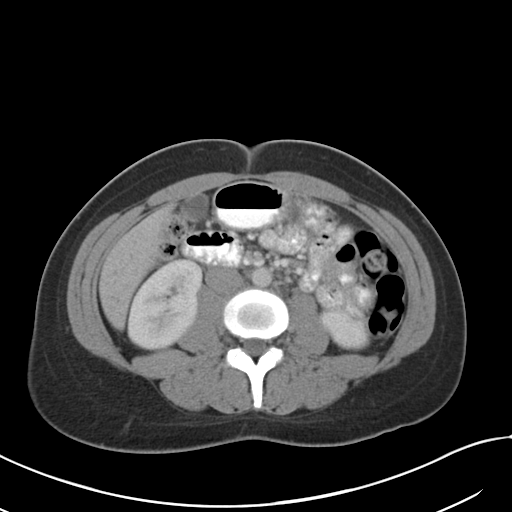
[im 59/89  soft-tissue]
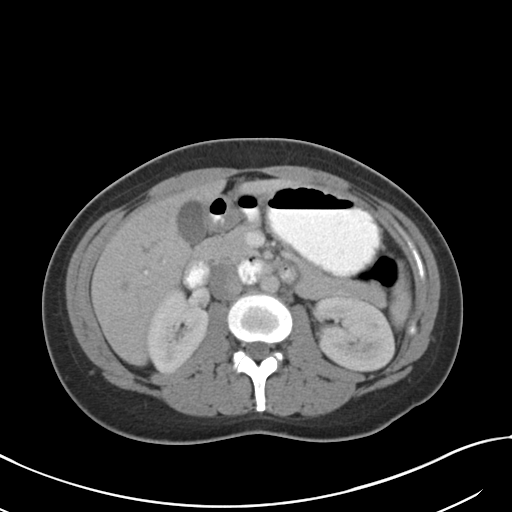
[im 59/89  bone]
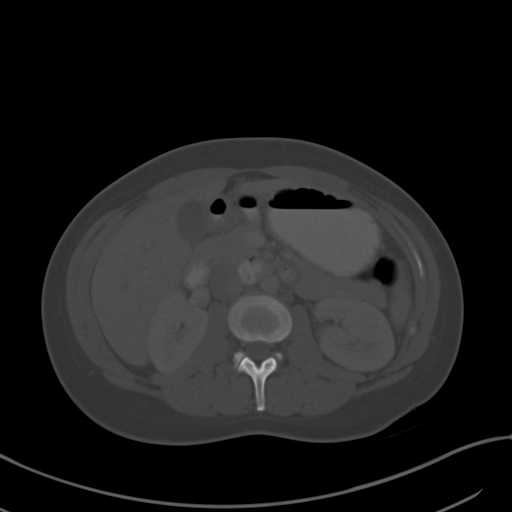
[im 63/89  soft-tissue]
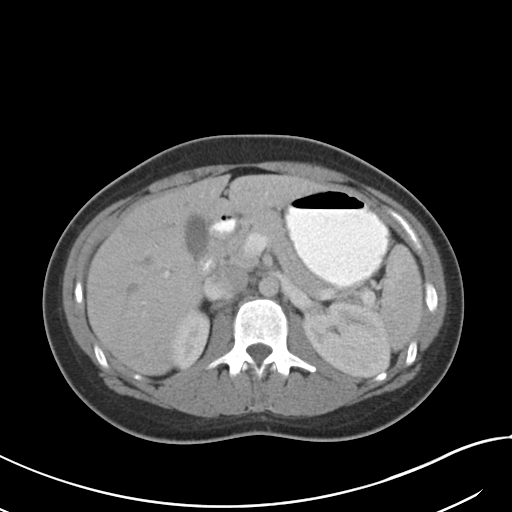
[im 70/89  soft-tissue]
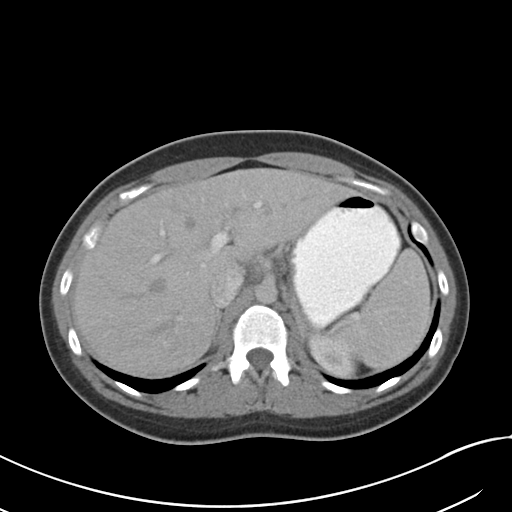
[im 78/89  soft-tissue]
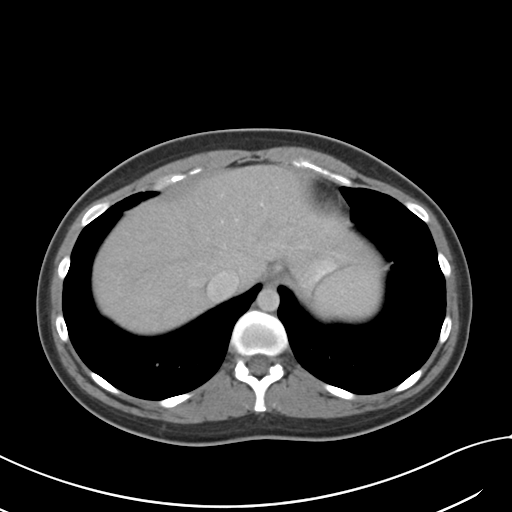
[im 85/89  soft-tissue]
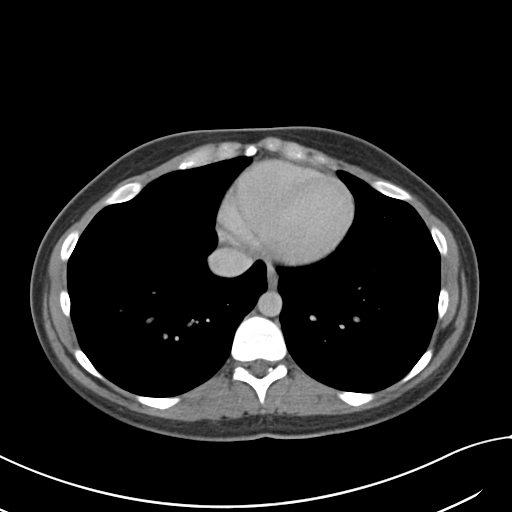

[Series 3: coronal a/|p · coronal · 0.63mm/px · 3 of 85 slices shown]
[im 29/85  soft-tissue]
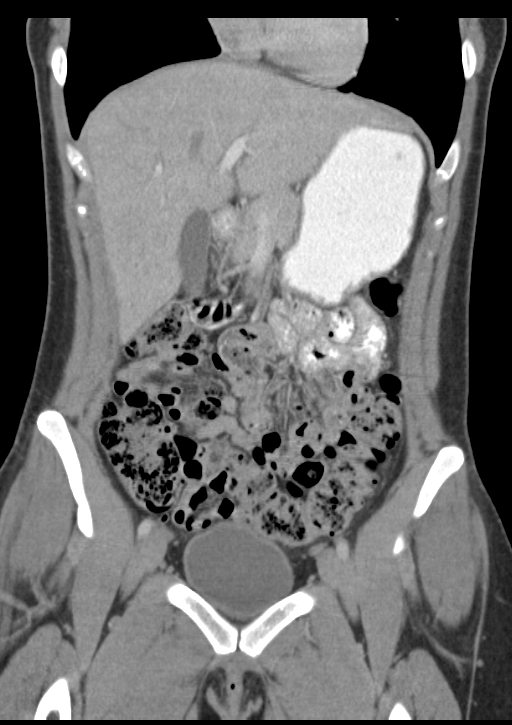
[im 38/85  soft-tissue]
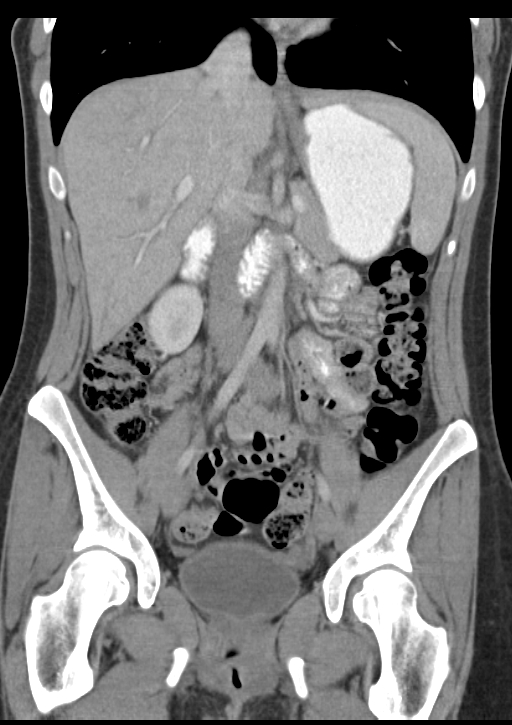
[im 47/85  soft-tissue]
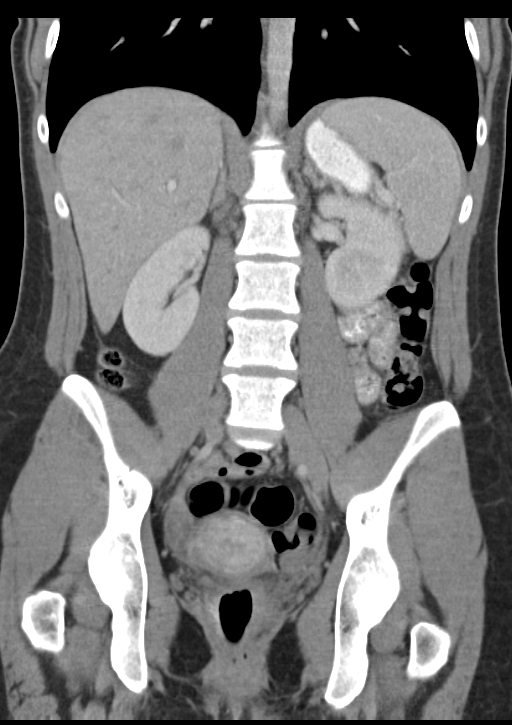

[16 of 46 positions shown; findings below may reference images not displayed]

FINDINGS: The visualized lung bases are clear.

The liver and spleen are unremarkable in appearance. The gallbladder
is within normal limits. The pancreas and adrenal glands are
unremarkable.

The kidneys are unremarkable in appearance. There is no evidence of
hydronephrosis. No renal or ureteral stones are seen. No perinephric
stranding is appreciated.

The small bowel is unremarkable in appearance. The stomach is within
normal limits. No acute vascular abnormalities are seen.

The appendix is normal in caliber and contains air, without evidence
of appendicitis. The colon is unremarkable in appearance.

The bladder is mildly distended and grossly unremarkable in
appearance. The uterus is grossly unremarkable. The ovaries are
grossly symmetric. No suspicious adnexal masses are seen. Trace free
fluid within the pelvis is likely physiologic. A tampon is noted at
the vagina. No inguinal lymphadenopathy is seen.

No acute osseous abnormalities are identified.
IMPRESSION: Unremarkable contrast-enhanced CT of the abdomen and pelvis.

## 2016-05-10 IMAGING — US US ABDOMEN COMPLETE
1 series · 14 of 25 positions shown · non-contrast
Comparison: Abdominal ultrasound September 13, 2015

CLINICAL DATA: Abdominal pain, history of gastric ulcers

EXAM:
ABDOMEN ULTRASOUND COMPLETE

[Series 1: us abdomen complete · 0.21mm/px · 14 of 81 slices shown]
[im 1/81]
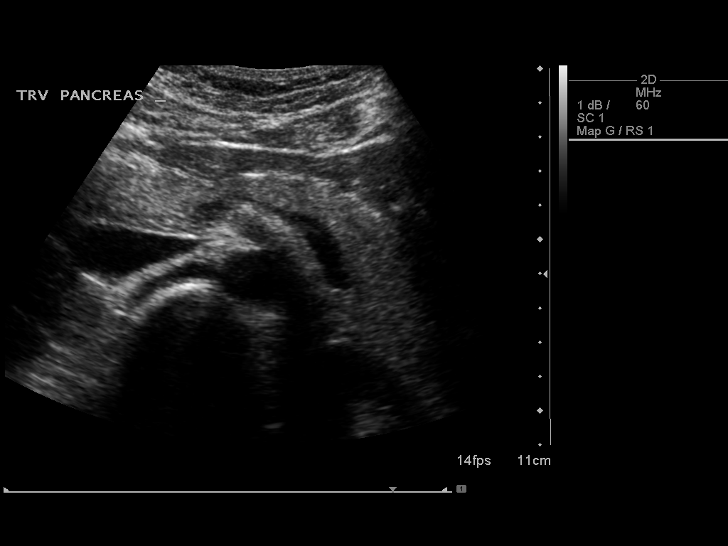
[im 7/81]
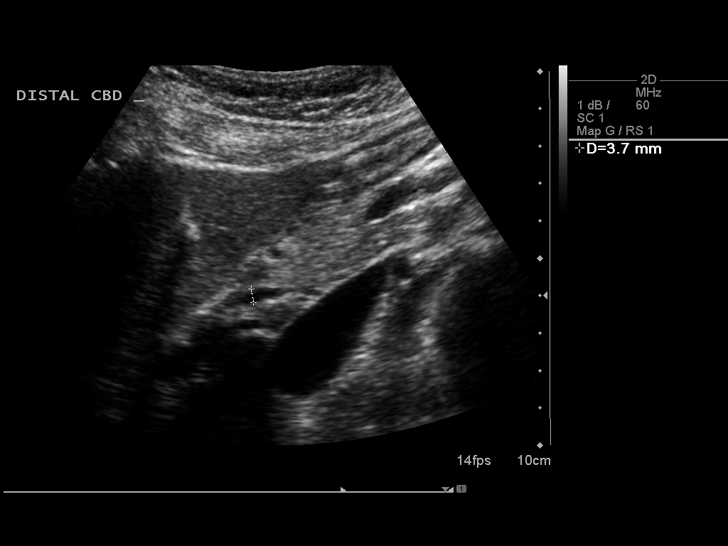
[im 14/81]
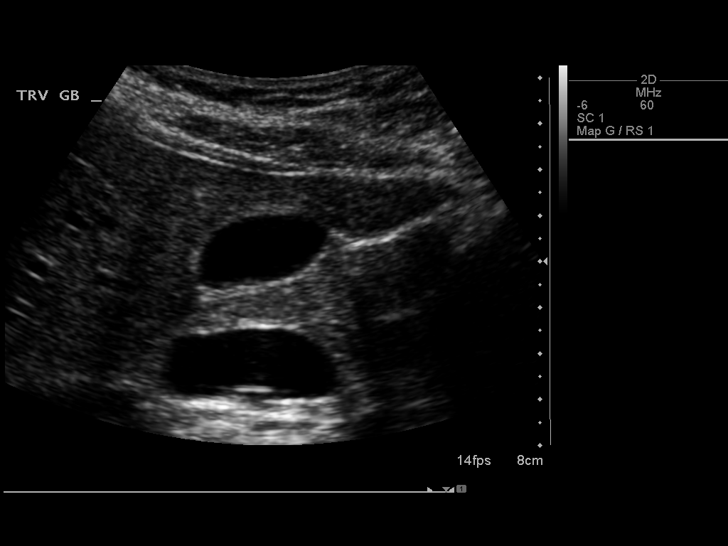
[im 21/81]
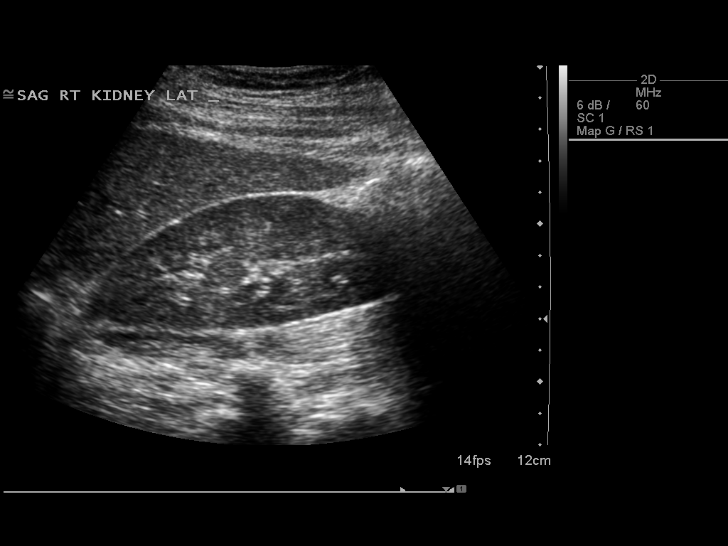
[im 27/81]
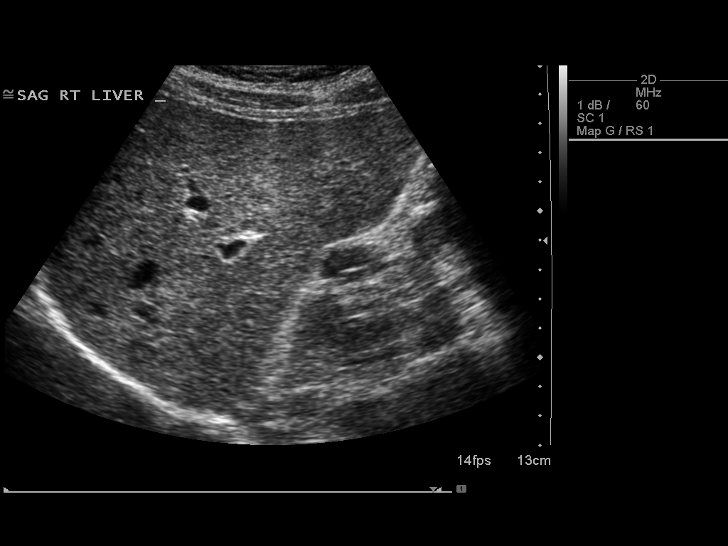
[im 31/81]
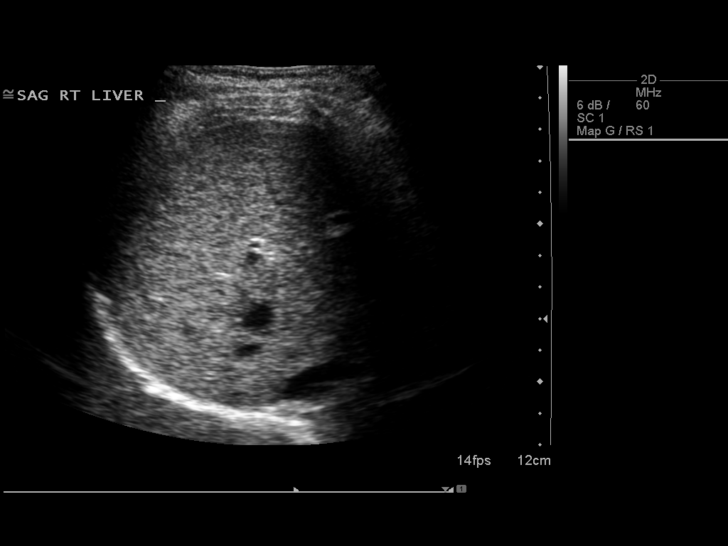
[im 37/81]
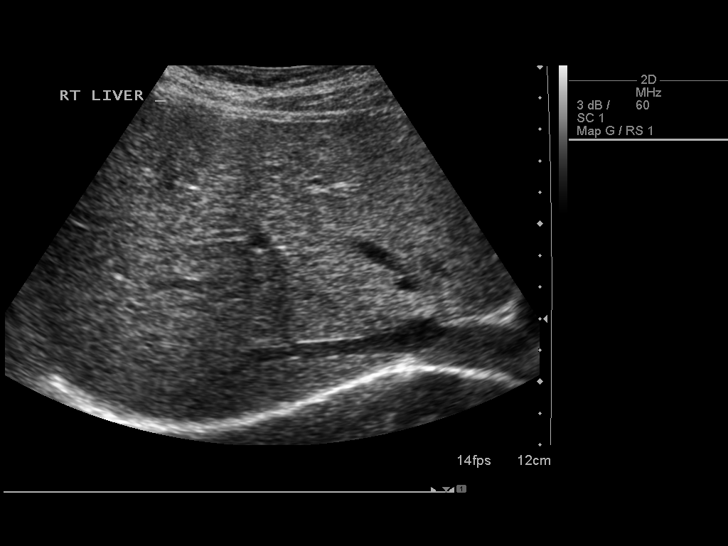
[im 44/81]
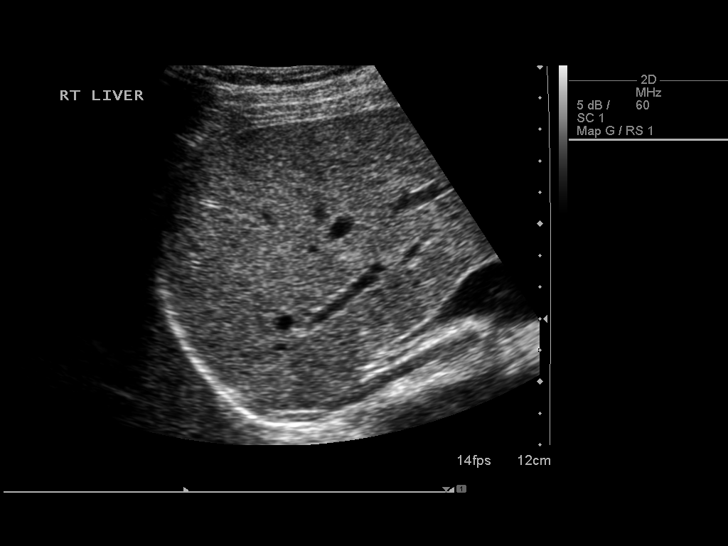
[im 51/81]
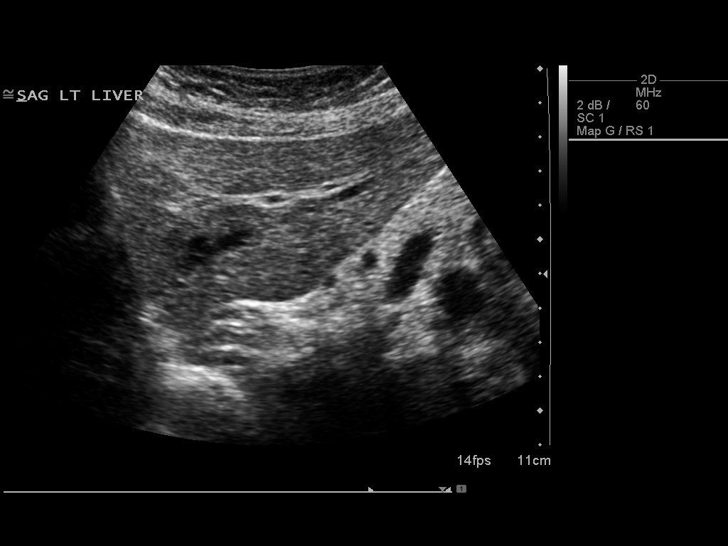
[im 54/81]
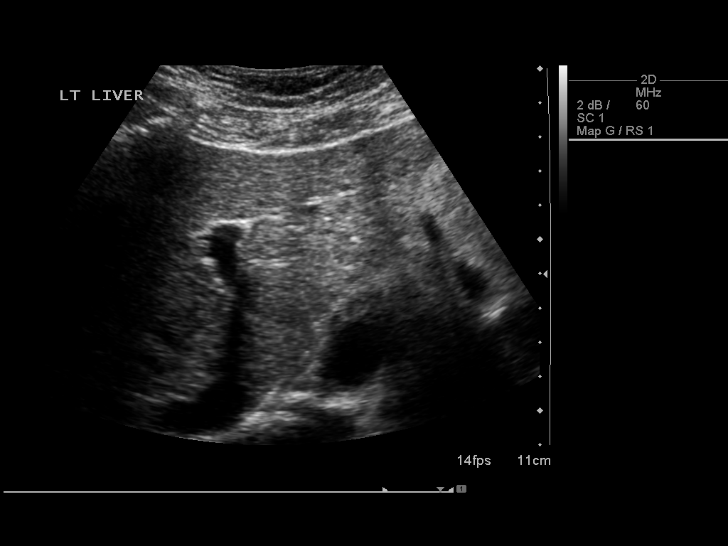
[im 61/81]
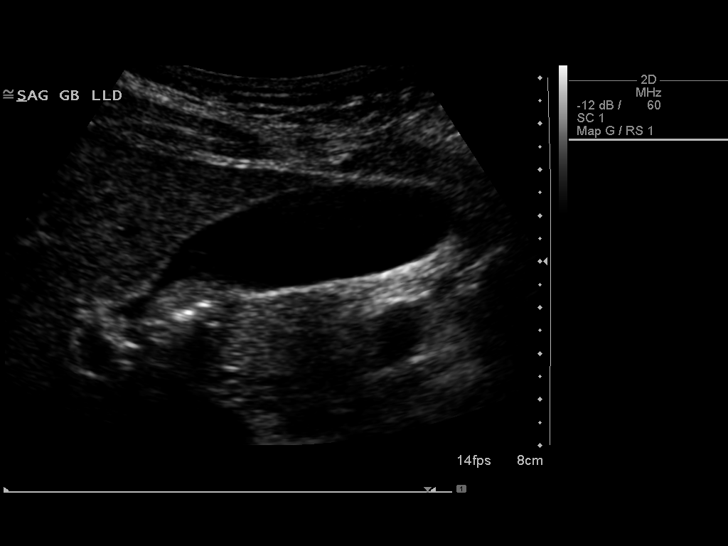
[im 67/81]
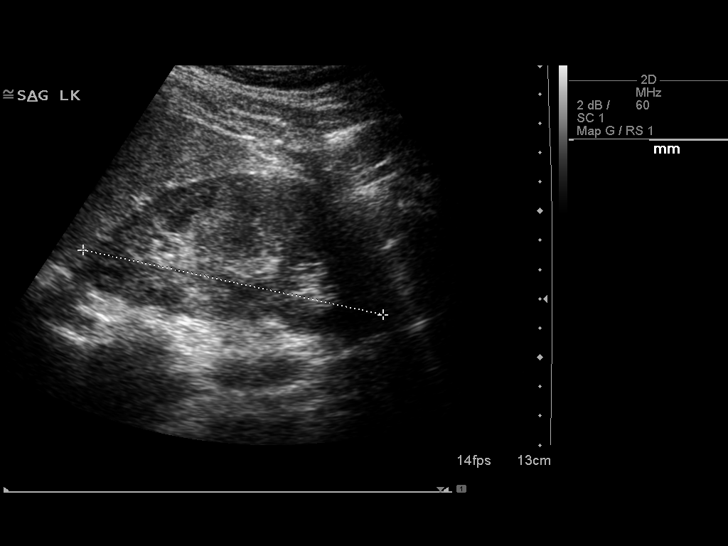
[im 74/81]
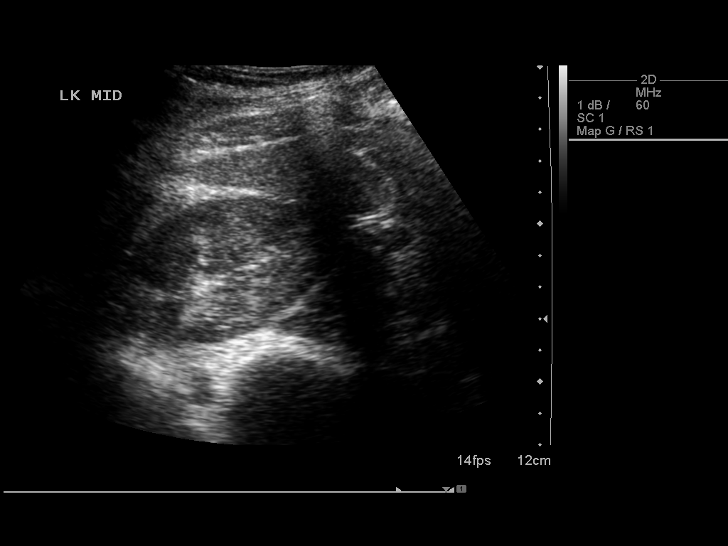
[im 81/81]
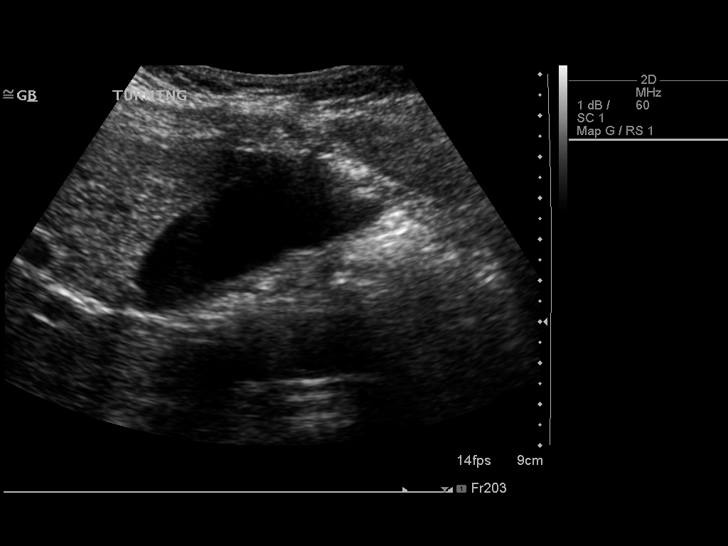

[14 of 25 positions shown; findings below may reference images not displayed]

FINDINGS: Gallbladder: No gallstones or wall thickening visualized. No
sonographic Murphy sign noted by sonographer.

Common bile duct: Diameter: 3.6 mm

Liver: No focal lesion identified. Within normal limits in
parenchymal echogenicity.

IVC: No abnormality visualized.

Pancreas: Visualized portion unremarkable.

Spleen: Size and appearance within normal limits.

Right Kidney: Length: 10.7 cm. Echogenicity within normal limits. No
mass or hydronephrosis visualized.

Left Kidney: Length: 10.5 cm. Echogenicity within normal limits. No
mass or hydronephrosis visualized.

Abdominal aorta: No aneurysm visualized.

Other findings: There is no ascites.
IMPRESSION: 1. No gallstones nor other acute hepatobiliary abnormality is
demonstrated. If gallbladder dysfunction is suspected clinically, a
nuclear medicine hepatobiliary scan may be useful.
2. No acute abnormality observed elsewhere within the abdomen.
